# Patient Record
Sex: Female | Born: 1950 | Race: White | Hispanic: No | Marital: Married | Smoking: Never smoker
Health system: Southern US, Community
[De-identification: ages and names within clinical notes are randomized; demographics above are authoritative.]

## PROBLEM LIST (undated history)

## (undated) DIAGNOSIS — F419 Anxiety disorder, unspecified: Secondary | ICD-10-CM

## (undated) DIAGNOSIS — C801 Malignant (primary) neoplasm, unspecified: Secondary | ICD-10-CM

## (undated) DIAGNOSIS — M1990 Unspecified osteoarthritis, unspecified site: Secondary | ICD-10-CM

## (undated) DIAGNOSIS — F329 Major depressive disorder, single episode, unspecified: Secondary | ICD-10-CM

## (undated) DIAGNOSIS — F32A Depression, unspecified: Secondary | ICD-10-CM

## (undated) DIAGNOSIS — E785 Hyperlipidemia, unspecified: Secondary | ICD-10-CM

## (undated) DIAGNOSIS — Z85528 Personal history of other malignant neoplasm of kidney: Secondary | ICD-10-CM

## (undated) DIAGNOSIS — S838X2A Sprain of other specified parts of left knee, initial encounter: Secondary | ICD-10-CM

## (undated) DIAGNOSIS — E119 Type 2 diabetes mellitus without complications: Secondary | ICD-10-CM

## (undated) DIAGNOSIS — H53003 Unspecified amblyopia, bilateral: Secondary | ICD-10-CM

## (undated) DIAGNOSIS — I10 Essential (primary) hypertension: Secondary | ICD-10-CM

## (undated) HISTORY — PX: BREAST EXCISIONAL BIOPSY: SUR124

## (undated) HISTORY — PX: EYE SURGERY: SHX253

## (undated) HISTORY — PX: JOINT REPLACEMENT: SHX530

## (undated) HISTORY — PX: BREAST SURGERY: SHX581

## (undated) HISTORY — PX: APPENDECTOMY: SHX54

## (undated) HISTORY — PX: TONSILLECTOMY: SUR1361

## (undated) HISTORY — PX: TUBAL LIGATION: SHX77

## (undated) HISTORY — PX: ABDOMINAL HYSTERECTOMY: SHX81

## (undated) HISTORY — PX: CHOLECYSTECTOMY: SHX55

## (undated) HISTORY — PX: CATARACT EXTRACTION W/ INTRAOCULAR LENS  IMPLANT, BILATERAL: SHX1307

---

## 1997-11-20 ENCOUNTER — Other Ambulatory Visit: Admission: RE | Admit: 1997-11-20 | Discharge: 1997-11-20 | Payer: Self-pay | Admitting: Obstetrics and Gynecology

## 1998-02-12 ENCOUNTER — Encounter: Payer: Self-pay | Admitting: Obstetrics and Gynecology

## 1998-02-17 ENCOUNTER — Inpatient Hospital Stay (HOSPITAL_COMMUNITY): Admission: RE | Admit: 1998-02-17 | Discharge: 1998-02-19 | Payer: Self-pay | Admitting: Obstetrics and Gynecology

## 1999-06-09 ENCOUNTER — Encounter: Payer: Self-pay | Admitting: General Surgery

## 1999-06-09 ENCOUNTER — Encounter: Admission: RE | Admit: 1999-06-09 | Discharge: 1999-06-09 | Payer: Self-pay | Admitting: General Surgery

## 2000-06-12 ENCOUNTER — Encounter: Payer: Self-pay | Admitting: Obstetrics and Gynecology

## 2000-06-12 ENCOUNTER — Encounter: Admission: RE | Admit: 2000-06-12 | Discharge: 2000-06-12 | Payer: Self-pay | Admitting: Obstetrics and Gynecology

## 2000-07-07 ENCOUNTER — Encounter: Payer: Self-pay | Admitting: Internal Medicine

## 2000-07-07 ENCOUNTER — Encounter: Admission: RE | Admit: 2000-07-07 | Discharge: 2000-07-07 | Payer: Self-pay | Admitting: Internal Medicine

## 2000-07-12 ENCOUNTER — Ambulatory Visit (HOSPITAL_COMMUNITY): Admission: RE | Admit: 2000-07-12 | Discharge: 2000-07-12 | Payer: Self-pay | Admitting: Internal Medicine

## 2001-06-13 ENCOUNTER — Encounter: Admission: RE | Admit: 2001-06-13 | Discharge: 2001-06-13 | Payer: Self-pay | Admitting: Obstetrics and Gynecology

## 2001-06-13 ENCOUNTER — Encounter: Payer: Self-pay | Admitting: Obstetrics and Gynecology

## 2001-06-27 ENCOUNTER — Encounter: Admission: RE | Admit: 2001-06-27 | Discharge: 2001-06-27 | Payer: Self-pay | Admitting: Internal Medicine

## 2001-06-27 ENCOUNTER — Encounter: Payer: Self-pay | Admitting: Internal Medicine

## 2002-06-18 ENCOUNTER — Encounter: Payer: Self-pay | Admitting: Obstetrics and Gynecology

## 2002-06-18 ENCOUNTER — Encounter: Admission: RE | Admit: 2002-06-18 | Discharge: 2002-06-18 | Payer: Self-pay | Admitting: Obstetrics and Gynecology

## 2003-06-06 ENCOUNTER — Other Ambulatory Visit: Admission: RE | Admit: 2003-06-06 | Discharge: 2003-06-06 | Payer: Self-pay | Admitting: Internal Medicine

## 2003-06-19 ENCOUNTER — Encounter: Admission: RE | Admit: 2003-06-19 | Discharge: 2003-06-19 | Payer: Self-pay | Admitting: Internal Medicine

## 2003-07-01 ENCOUNTER — Encounter: Admission: RE | Admit: 2003-07-01 | Discharge: 2003-07-01 | Payer: Self-pay | Admitting: Internal Medicine

## 2004-07-02 ENCOUNTER — Encounter: Admission: RE | Admit: 2004-07-02 | Discharge: 2004-07-02 | Payer: Self-pay | Admitting: Internal Medicine

## 2005-07-04 ENCOUNTER — Encounter: Admission: RE | Admit: 2005-07-04 | Discharge: 2005-07-04 | Payer: Self-pay | Admitting: Internal Medicine

## 2005-07-22 ENCOUNTER — Encounter: Admission: RE | Admit: 2005-07-22 | Discharge: 2005-07-22 | Payer: Self-pay | Admitting: Internal Medicine

## 2005-07-25 ENCOUNTER — Ambulatory Visit (HOSPITAL_COMMUNITY): Admission: RE | Admit: 2005-07-25 | Discharge: 2005-07-25 | Payer: Self-pay | Admitting: Urology

## 2005-08-16 ENCOUNTER — Inpatient Hospital Stay (HOSPITAL_COMMUNITY): Admission: RE | Admit: 2005-08-16 | Discharge: 2005-08-18 | Payer: Self-pay | Admitting: Obstetrics and Gynecology

## 2005-08-16 ENCOUNTER — Encounter (INDEPENDENT_AMBULATORY_CARE_PROVIDER_SITE_OTHER): Payer: Self-pay | Admitting: Specialist

## 2005-08-16 HISTORY — PX: OTHER SURGICAL HISTORY: SHX169

## 2005-12-01 ENCOUNTER — Ambulatory Visit (HOSPITAL_COMMUNITY): Admission: RE | Admit: 2005-12-01 | Discharge: 2005-12-01 | Payer: Self-pay | Admitting: *Deleted

## 2005-12-30 ENCOUNTER — Ambulatory Visit: Payer: Self-pay | Admitting: Hematology and Oncology

## 2006-06-06 ENCOUNTER — Ambulatory Visit (HOSPITAL_COMMUNITY): Admission: RE | Admit: 2006-06-06 | Discharge: 2006-06-06 | Payer: Self-pay | Admitting: Urology

## 2006-07-06 ENCOUNTER — Encounter: Admission: RE | Admit: 2006-07-06 | Discharge: 2006-07-06 | Payer: Self-pay | Admitting: Internal Medicine

## 2006-07-13 ENCOUNTER — Encounter: Admission: RE | Admit: 2006-07-13 | Discharge: 2006-07-13 | Payer: Self-pay | Admitting: Orthopedic Surgery

## 2006-12-05 ENCOUNTER — Encounter: Admission: RE | Admit: 2006-12-05 | Discharge: 2006-12-05 | Payer: Self-pay | Admitting: Urology

## 2006-12-06 ENCOUNTER — Encounter: Admission: RE | Admit: 2006-12-06 | Discharge: 2006-12-06 | Payer: Self-pay | Admitting: Urology

## 2007-05-18 ENCOUNTER — Ambulatory Visit (HOSPITAL_COMMUNITY): Admission: RE | Admit: 2007-05-18 | Discharge: 2007-05-18 | Payer: Self-pay | Admitting: Urology

## 2007-06-05 ENCOUNTER — Ambulatory Visit (HOSPITAL_COMMUNITY): Admission: RE | Admit: 2007-06-05 | Discharge: 2007-06-06 | Payer: Self-pay | Admitting: Orthopedic Surgery

## 2007-06-05 HISTORY — PX: OTHER SURGICAL HISTORY: SHX169

## 2007-08-27 ENCOUNTER — Encounter: Admission: RE | Admit: 2007-08-27 | Discharge: 2007-08-27 | Payer: Self-pay | Admitting: Internal Medicine

## 2007-09-10 ENCOUNTER — Encounter: Admission: RE | Admit: 2007-09-10 | Discharge: 2007-09-10 | Payer: Self-pay | Admitting: Internal Medicine

## 2007-10-04 IMAGING — CT CT PELVIS W/ CM
1 of 6 series · 12 of 32 positions shown, 18 images · IV contrast (READICAT/WATER & [ID] OMNI 300)
Comparison: none

CLINICAL DATA: Left renal mass noted on virtual colonoscopy. 
 ABDOMEN CT WITH CONTRAST:
TECHNIQUE: Multidetector CT imaging of the abdomen was performed following the standard protocol during bolus administration of intravenous contrast.
 Contrast:  125 cc Omnipaque 300
TECHNIQUE: Multidetector CT imaging of the pelvis was performed following the standard protocol during bolus administration of intravenous contrast.

[Series 4: recon 3: routine abdomen · axial · 0.94mm/px · z∈[-413,-16]mm · 12 of 372 slices shown, 18 images]
[im 27/372  soft-tissue]
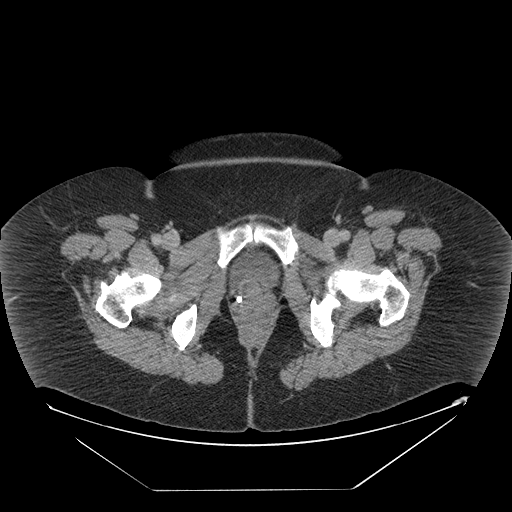
[im 27/372  bone]
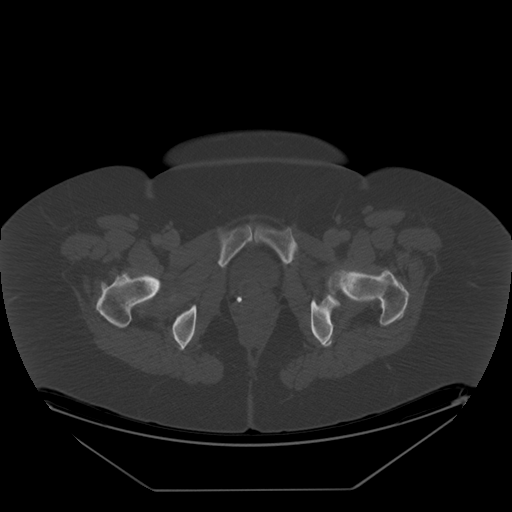
[im 54/372  soft-tissue]
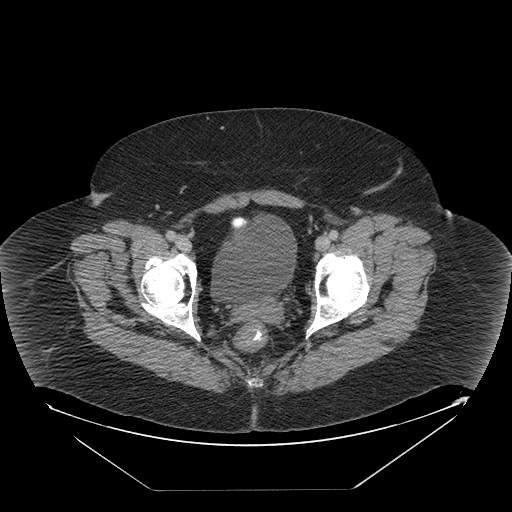
[im 80/372  soft-tissue]
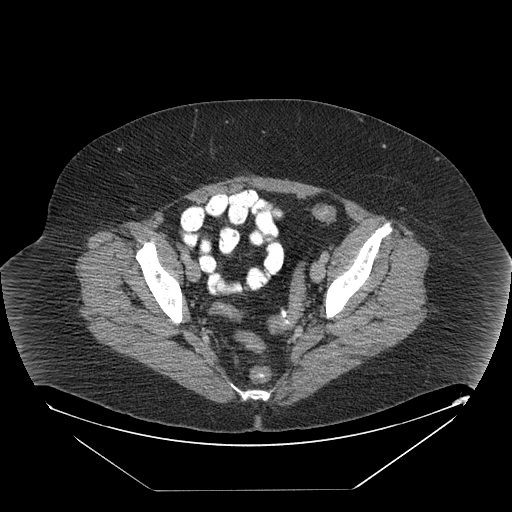
[im 107/372  soft-tissue]
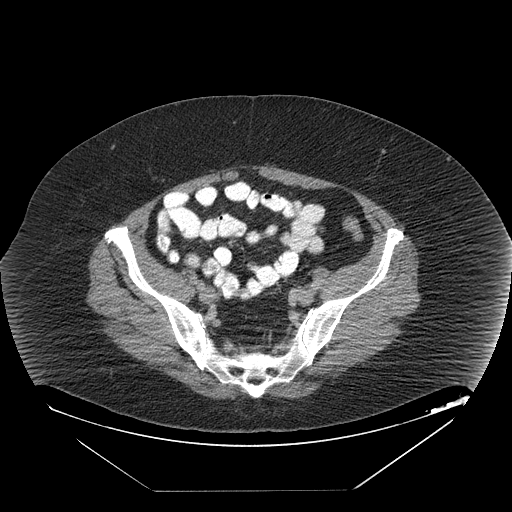
[im 133/372  soft-tissue]
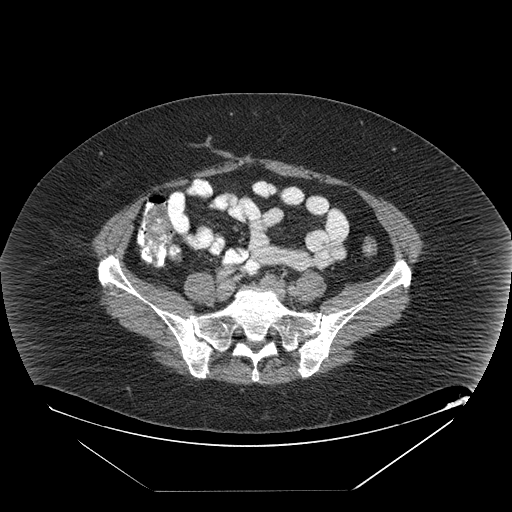
[im 160/372  soft-tissue]
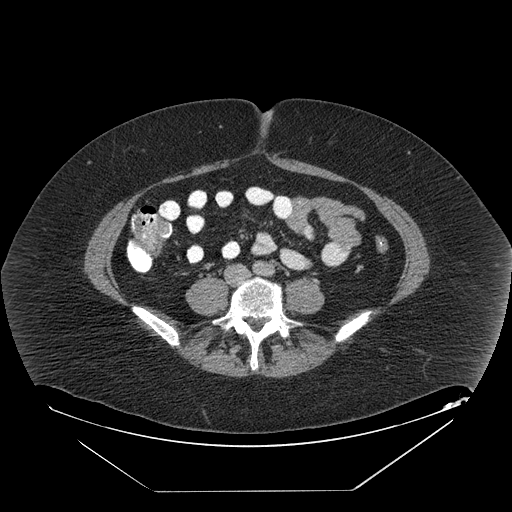
[im 213/372  soft-tissue]
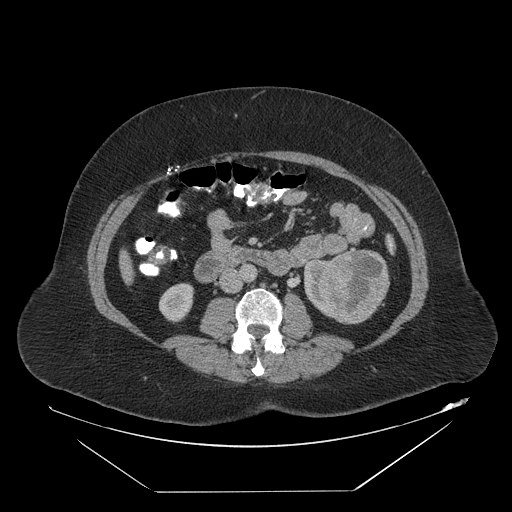
[im 239/372  soft-tissue]
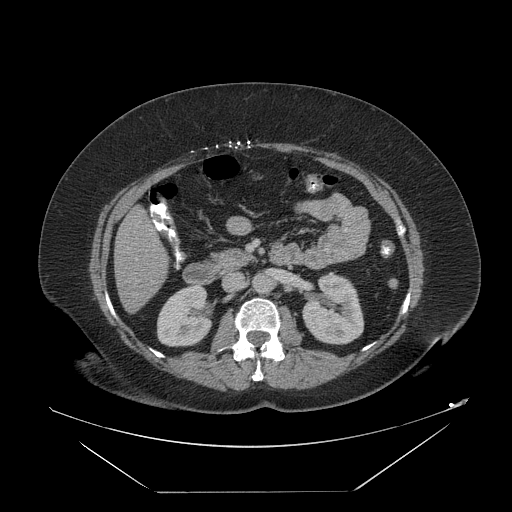
[im 266/372  soft-tissue]
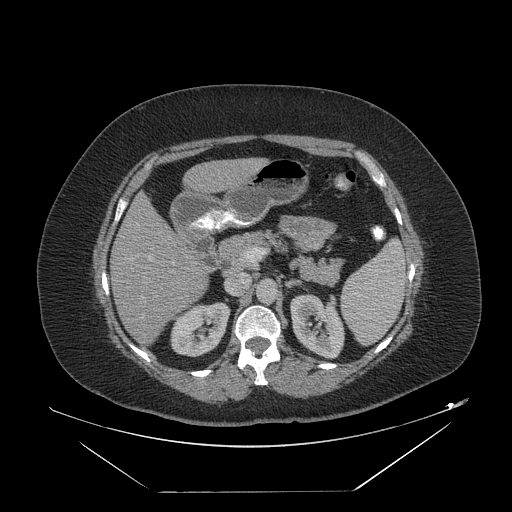
[im 266/372  lung]
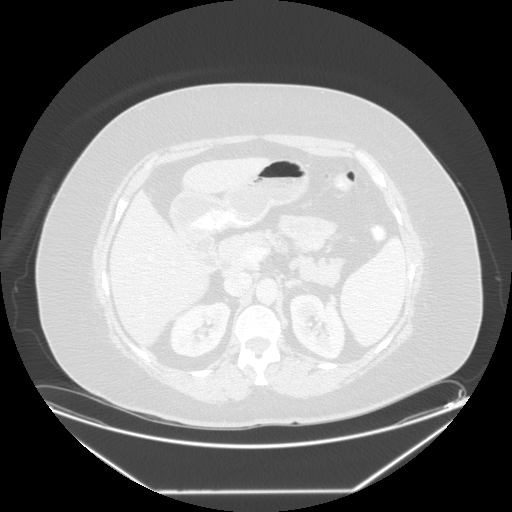
[im 266/372  bone]
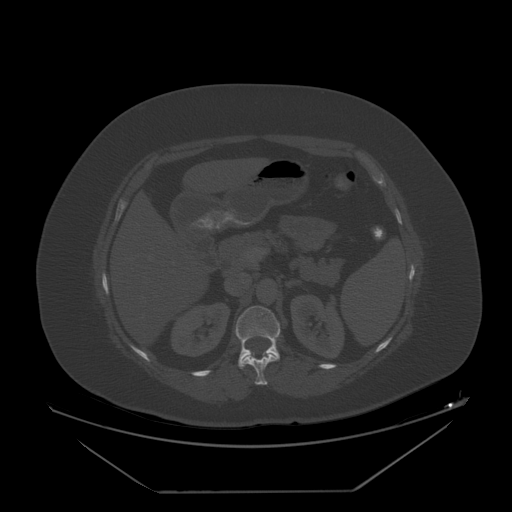
[im 292/372  soft-tissue]
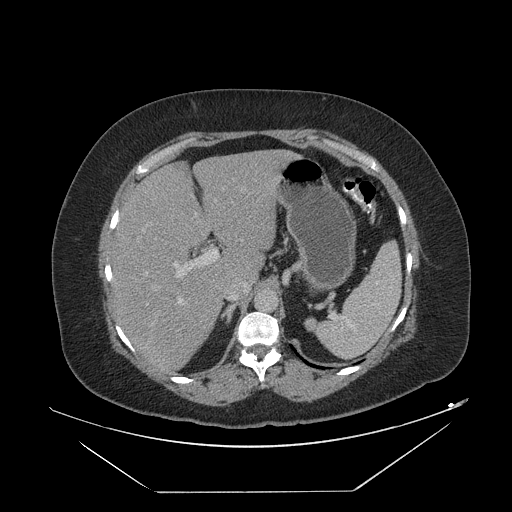
[im 292/372  lung]
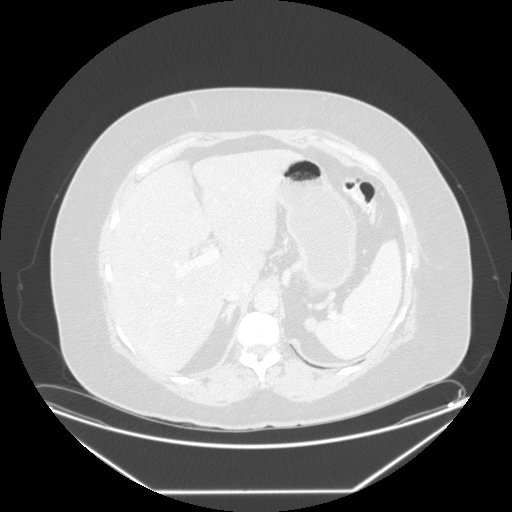
[im 319/372  soft-tissue]
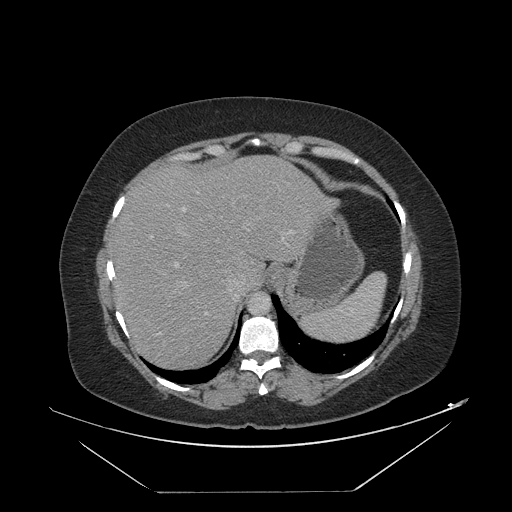
[im 319/372  lung]
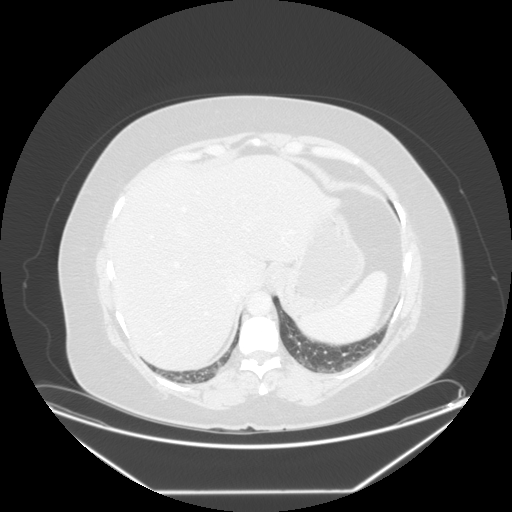
[im 345/372  soft-tissue]
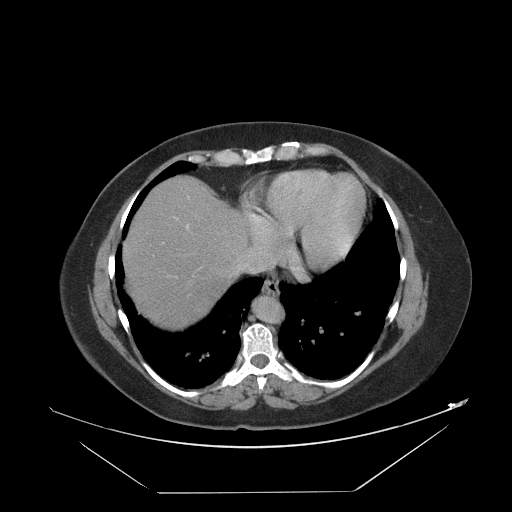
[im 345/372  lung]
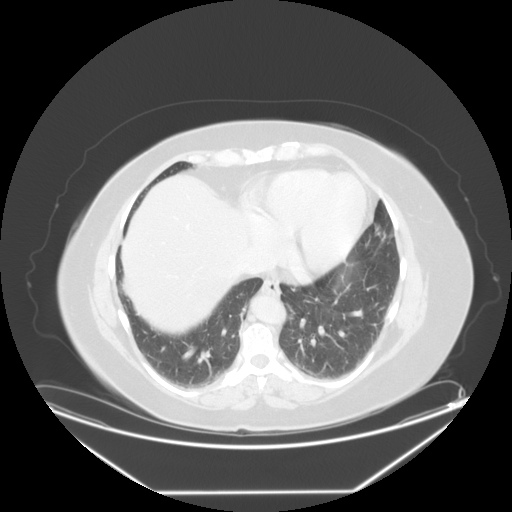

[12 of 32 positions shown; findings below may reference images not displayed]

FINDINGS: The lung bases are clear.  There is mild fatty infiltration throughout the liver without focal abnormality.   No ductal dilatation is seen.  The gallbladder appears to have been removed.  The pancreas is normal in size with normal peripancreatic fat planes.  The adrenal glands and spleen appear normal.  Probable small splenule is noted just caudal to the tip of the spleen.  The kidneys enhance well and there is an enhancing mass of mixed attenuation emanating from the lower pole of the left kidney laterally.  In its largest dimension this mass measures 72 x 62 mm and on coronally reconstructed images has a height of 72 mm.  This is most consistent with renal cell carcinoma.  The left renal vein opacifies normally with no evidence of tumor invasion.  No surrounding enlarged nodes are seen.  There are slightly prominent vessels however along the lateral and caudal extent of this left renal tumor.
IMPRESSION: 1.  Left lower pole renal mass measuring 72 x 62 x 72 mm consistent with left renal cell carcinoma.  The left renal vein appears patent and no adenopathy is seen.  There are somewhat prominent draining veins along the inferior and lateral aspect of this left renal mass. 
 2.  Fatty infiltration of the liver.  
 PELVIS CT WITH CONTRAST:
FINDINGS: The urinary bladder is unremarkable.  The patient has previously undergone hysterectomy.  Scattered colonic diverticula are present.  No pelvic mass or adenopathy is seen.
IMPRESSION: Negative CT of the pelvis.

## 2007-11-20 ENCOUNTER — Ambulatory Visit (HOSPITAL_COMMUNITY): Admission: RE | Admit: 2007-11-20 | Discharge: 2007-11-20 | Payer: Self-pay | Admitting: Urology

## 2008-05-15 ENCOUNTER — Ambulatory Visit (HOSPITAL_COMMUNITY): Admission: RE | Admit: 2008-05-15 | Discharge: 2008-05-15 | Payer: Self-pay | Admitting: Urology

## 2008-10-01 ENCOUNTER — Encounter: Admission: RE | Admit: 2008-10-01 | Discharge: 2008-10-01 | Payer: Self-pay | Admitting: Internal Medicine

## 2008-11-06 ENCOUNTER — Ambulatory Visit (HOSPITAL_COMMUNITY): Admission: RE | Admit: 2008-11-06 | Discharge: 2008-11-06 | Payer: Self-pay | Admitting: Urology

## 2009-02-03 ENCOUNTER — Encounter: Admission: RE | Admit: 2009-02-03 | Discharge: 2009-02-03 | Payer: Self-pay | Admitting: Orthopedic Surgery

## 2009-05-11 ENCOUNTER — Ambulatory Visit (HOSPITAL_COMMUNITY): Admission: RE | Admit: 2009-05-11 | Discharge: 2009-05-11 | Payer: Self-pay | Admitting: Urology

## 2009-10-02 ENCOUNTER — Encounter: Admission: RE | Admit: 2009-10-02 | Discharge: 2009-10-02 | Payer: Self-pay | Admitting: Internal Medicine

## 2009-11-16 ENCOUNTER — Ambulatory Visit (HOSPITAL_COMMUNITY): Admission: RE | Admit: 2009-11-16 | Discharge: 2009-11-16 | Payer: Self-pay | Admitting: Urology

## 2010-05-24 ENCOUNTER — Ambulatory Visit (HOSPITAL_COMMUNITY)
Admission: RE | Admit: 2010-05-24 | Discharge: 2010-05-24 | Payer: Self-pay | Source: Home / Self Care | Attending: Urology | Admitting: Urology

## 2010-08-19 ENCOUNTER — Ambulatory Visit (HOSPITAL_COMMUNITY): Admission: RE | Admit: 2010-08-19 | Payer: Self-pay | Source: Ambulatory Visit | Admitting: Orthopedic Surgery

## 2010-09-14 ENCOUNTER — Other Ambulatory Visit: Payer: Self-pay | Admitting: Family Medicine

## 2010-09-14 DIAGNOSIS — Z1231 Encounter for screening mammogram for malignant neoplasm of breast: Secondary | ICD-10-CM

## 2010-09-14 NOTE — Op Note (Signed)
Christina Cook, KEMP              ACCOUNT NO.:  [id number]   MEDICAL RECORD NO.:  [id number]          PATIENT TYPE:  OIB   LOCATION:  1617                         FACILITY:  [facility]   PHYSICIAN:  Georges Lynch. Gioffre, M.D.DATE OF BIRTH:  05/09/1950   DATE OF PROCEDURE:  06/05/2007  DATE OF DISCHARGE:                               OPERATIVE REPORT   PREOPERATIVE DIAGNOSIS:  1. Severe impingement syndrome, right shoulder.  2. Torn rotator cuff tendon, right shoulder.   POSTOPERATIVE DIAGNOSIS:  1. Severe impingement syndrome, right shoulder.  2. Torn rotator cuff tendon, right shoulder.   OPERATION:  1. Open acromionectomy and acromioplasty, right shoulder.  2. Repair of a torn rotator cuff tendon, right shoulder, utilizing a      small Restore tendon patch.   PROCEDURE:  Under general anesthesia, routine orthopedic prepping and  draping of the right shoulder was carried out.  She had 2 grams IV  Ancef.  At this time, a small incision was made over the anterior aspect  of the right shoulder.  Bleeders were identified and cauterized.  Self-  retaining retractors were inserted.  Following that, I identified the  acromion.  She had a severely thickened downsloping acromion that was  literally upbiting and eroding into the rotator cuff.  I protected the  underlying cuff.  I excised the subdeltoid bursa first, protected the  cuff with a Bennett retractor, and did a partial acromionectomy and  acromioplasty with the oscillating saw and the bur.  Following that, I  irrigated out the shoulder and I bone waxed the under surface of the  acromion.  Following that, I went down and identified a tear of the  distal insertion of the rotator cuff.  I then sutured a Restore graft  into this area.  It was markedly thinned out.  No Mitek sutures were  necessary.  I thoroughly irrigated out the area and then I inserted some  thrombin soaked Gelfoam into the subacromial space.  Following that, I  then  reapproximated the deltoid tendon and muscle in the usual fashion.  The subcu was closed with 0 Vicryl and the skin with metal staples.  A  sterile Neosporin dressing was applied.  We did inject 0.5% Marcaine  with epinephrine, about 10 mL, into the operative site.           ______________________________  Georges Lynch. Darrelyn Hillock, M.D.     RAG/MEDQ  D:  06/05/2007  T:  06/05/2007  Job:  [id number]

## 2010-09-17 NOTE — Discharge Summary (Signed)
Christina Cook, Christina Cook              ACCOUNT NO.:  [id number]   MEDICAL RECORD NO.:  [id number]          PATIENT TYPE:  INP   LOCATION:  1413                         [facility]ACILITY:  [facility]   PHYSICIAN:  Excell Seltzer. Annabell Howells, M.D.    DATE OF BIRTH:  18-Nov-1950   DATE OF ADMISSION:  08/16/2005  DATE OF DISCHARGE:  08/18/2005                                 DISCHARGE SUMMARY   ADMITTING DIAGNOSES:  Left 6 cm lower pole mass suspicious for renal cell  carcinoma.   DISCHARGE DIAGNOSES:  Left 6 cm lower pole mass suspicious for renal cell  carcinoma.   PROCEDURE DONE:  Left hand assisted laparoscopic radical nephrectomy.   HISTORY OF PRESENT ILLNESS AND HOSPITAL COURSE:  Christina Cook is a 60 year old  lady who underwent a CT scan for ____.  She was noted to have a 6 cm left  lower pole mass suspicious for renal cancer.  _____ work-up at the time was  negative.  After extensive counseling she elected to undergo a hand assisted  laparoscopic left radical nephrectomy.  The patient was admitted on August 16, 2005 which she successfully underwent procedure.  Postoperatively the  patient had a Foley catheter in place and was observed for two days on the  floor.  Postoperative day one her Foley catheter was removed.  Patient  voided with no problems.  Postoperative day two her pain was adequately  controlled and patient was tolerating p.o. diet.  At that point the patient  was stable to go home.  At discharge the patient was afebrile.  Vital signs  were stable.  Abdomen was soft and nontender.  Her wound was clean, dry, and  intact with no evidence of wound drainage or erythema.  The patient was  subsequently discharged home on p.o. pain medications.  The patient has a  follow-up with Dr. Annabell Howells in about one to two weeks in the office for a  postoperative visit.  Should the patient have any fever, chills, nausea,  vomiting, abdominal pain, or wound drainage ____ she is to contact us or  come to the ED.     ______________________________  Terie Purser, MD      Excell Seltzer. Annabell Howells, M.D.  Electronically Signed    JH/MEDQ  D:  08/23/2005  T:  08/24/2005  Job:  [id number]

## 2010-09-17 NOTE — Op Note (Signed)
NAMENANCY, ARVIN              ACCOUNT NO.:  [id number]   MEDICAL RECORD NO.:  [id number]          PATIENT TYPE:  INP   LOCATION:  1413                         FACILITY:  [facility]   PHYSICIAN:  Excell Seltzer. Annabell Howells, M.D.    DATE OF BIRTH:  May 06, 1950   DATE OF PROCEDURE:  08/16/2005  DATE OF DISCHARGE:                                 OPERATIVE REPORT   PROCEDURE:  Left hand assisted laparoscopic radical nephrectomy.   PREOPERATIVE DIAGNOSIS:  Left renal mass.   POSTOPERATIVE DIAGNOSIS:  Left renal mass, probable renal cell carcinoma.   SURGEON:  Dr. Bjorn Pippin.   ASSISTANT:  Dr. Cornelious Bryant.   ANESTHESIA:  General.   DRAIN:  Foley catheter.   SPECIMEN:  Left kidney with mass.   BLOOD LOSS:  Approximately 200 mL.   COMPLICATIONS:  None.   INDICATIONS:  Ms. Grays is a 60 year old white female who underwent CT scan  for abdominal pain.  She was found to have a 6 cm left lower pole mass  suspicious for renal cell carcinoma.  Metastatic workup was negative.  She  is to undergo hand assisted laparoscopic left radical nephrectomy.   FINDINGS AND PROCEDURE:  The patient is given a gram Ancef.  She was fitted  with PAS hose.  She was taken to the operating room where general anesthetic  was induced.  She was rolled into about 45 degrees flank position on the  beanbag.  After she underwent after induction of anesthesia an 18-French  Foley catheter was inserted using sterile technique.  Then she was  positioned in a 45 degrees flank position using the beanbag. An axillary  roll was placed.  The legs and heels were padded appropriately.  An arm  support was used to secure the left arm.  Once in position, the table was  flexed and the patient was secured using cloth tape.   At this point the patient was prepped with Betadine solution and draped in  usual sterile fashion.  A approximately 5-6 cm incision was made lateral to  the umbilicus with the lower margin of the incision at the  level of the  umbilicus.  The subcutaneous tissue was incised with the Bovie down to the  fascia.  The anterior rectus fascia was opened with the Bovie.  A small  arterial bleeder was encountered and controlled with a 3-0 chromic suture  ligature.  The rectus muscle was reflected laterally and posterior sheath  was incised allowing entry into the peritoneal cavity.  Palpation revealed  no adhesions in the area of the incision and at this point a gel port hand  port was inserted and secured.   The12 mm trocar was placed through the gel port opening and the abdomen was  insufflated.  A 0 degrees lens was then passed through the port and the  abdomen was inspected.  No significant adhesions were noted.  A 12 mm trocar  was placed approximately 5 mm lateral to the edge of the gel port, the skin  was incised with a knife.  The fat was spread with hemostat and the  trocar  was placed under visual guidance.  Once in position, the camera was moved to  this port and the CO2 insufflation was placed through this port as well.  We  then placed port.  A second 12 mm port was placed approximately midway  between the top of the gel port and the xiphoid. Similar incision was made.  The fat was spread and the trocar was placed under direct vision.  The  camera was then moved to this port.   At this point a lap port was placed through the gel port and my hand was  inserted.  Using the Harmonic scissors the dissection was initiated.  The  white line was incised from the level of spleen down off the lower pole the  kidney and the colon was reflected medially. I then took some additional  renal ligaments down and attachment of the spleen to the abdominal wall.  At  this point I moved to the lower pole where the lower pole attachments were  taken down.  The gonadal vein was identified and divided between Hem-o-lok  clips.  The ureter was then identified and divided between Hem-o-lok clips.  Dissection plane  was developed between the body wall and Gerota's fascia  posteriorly leaving lateral attachments initially.  The dissection was  carried up into the hilar region once again using finger dissection and the  Harmonic scissors.  The vein was identified and the artery could be palpable  posterior to the vein.  I then dissected superior to the to the pedicle and  created a window that allowed me to encircle the vascular pedicle with my  fingers.  The pedicle was thinned out until it consisted of the artery, vein  and a small amount of surrounding lymphatic and fatty tissue.  Endo-GIA was  then passed and articulated in the appropriate direction and manipulated  around the vascular pedicle.  Once appropriately seated, the device was  closed and fired dividing the pedicle.  Inspection of the pedicle revealed  no oozing or bleeding.  The residual attachments of the upper pole were then  taken down with finger dissection and harmonic scalpel.  The adrenal gland  was retained.  Once this portion of the dissection had been completed, the  remaining lateral attachments were taken down freeing the kidney.  The  kidney was moved out of the renal fossa and inspection revealed no active  bleeding.  The splenic capsule was intact.   At this point, the lap pad was removed from the abdominal cavity.  An  12 mm  lateral port was removed and a large entrapment sac was passed through this  port site.  The kidney was then placed in the entrapment sac and secured.  The delivery device was removed.  The string was then moved to the gel port.  Final inspection demonstrated no bleeding from the lateral port site.  The  12 mm trocar was reinserted through this port site and the superior port  site was evaluated as well and the trocar was removed under direct vision  once again without evidence of bleeding.  At this point the lateral trocar and camera removed once again.  The gel port cap was then removed and the   specimen was retrieved without need for extending the incision.  The inner  portion of the gel port was then removed and the incision was closed using a  running #1 PDS on the rectus fascia.  The subcutaneous tissue was irrigated  and the skin was closed with skin staples.  The two laparoscopy ports were  also closed with skin staples after irrigation.   At this point a dressing was applied.  The drapes removed.  The patient was  rolled back supine, her anesthetic was reversed and she was removed to the  recovery room in stable condition.  There were no complications.      Excell Seltzer. Annabell Howells, M.D.  Electronically Signed     JJW/MEDQ  D:  08/16/2005  T:  08/17/2005  Job:  [id number]   cc:   Soyla Murphy. Renne Crigler, M.D.  Fax: [phone]

## 2010-09-17 NOTE — Op Note (Signed)
Christina Cook, Christina Cook              ACCOUNT NO.:  [id number]   MEDICAL RECORD NO.:  [id number]          PATIENT TYPE:  AMB   LOCATION:  ENDO                         FACILITY:  [facility]   PHYSICIAN:  Georgiana Spinner, M.D.    DATE OF BIRTH:  30-Sep-1950   DATE OF PROCEDURE:  12/01/2005  DATE OF DISCHARGE:                                 OPERATIVE REPORT   PROCEDURE:  Colonoscopy.   INDICATIONS:  Questionable area of abnormality found on virtual CT scan.   ANESTHESIA:  1.  Demerol 100 mg.  2.  Versed 10 mg.   PROCEDURE:  With the patient mildly sedated in the left lateral decubitus  position, the Olympus videoscopic colonoscope was inserted into the rectum  and passed under direct vision to the cecum, identified by ileocecal valve  and appendiceal orifice, both of which were photographed. From this point,  the colonoscope was slowly withdrawn, taking circumferential views of the  colonic mucosa, and as we approached 50 cm from anal verge I made extra care  to look at the areas under every fold as best I could, one back to just  distal to the 30 cm mark.  I reinserted back to the 50 cm mark, then pulled  back once again, taking circumferential views once again of the colonic  mucosa until we reached the rectum, which appeared normal on direct and  showed hemorrhoidal tissue on retroflexed view. The endoscope was then  straightened and readvanced to 70 cm from anal verge, and once again was  withdrawn slowly, taking circumferential views of colonic mucosa, stopping  only to notice the diverticula seen in the sigmoid colon and the thickening  of the colon noted there until we reached the rectum once again, at which  point we withdrew the endoscope.  The patient's vital signs and pulse  oximeter remained stable.  The patient tolerated procedure well, with no  apparent complications.   FINDINGS:  Diverticulosis of sigmoid colon.  Internal hemorrhoids, but  otherwise an unremarkable  examination. Thickening of the sigmoid colon noted  in the area of the diverticulosis, but no other abnormalities noted on  careful inspection of the colon.   PLAN:  Consider repeat examination possibly in 3 years.           ______________________________  Georgiana Spinner, M.D.     GMO/MEDQ  D:  12/01/2005  T:  12/01/2005  Job:  [id number]

## 2010-10-04 ENCOUNTER — Ambulatory Visit
Admission: RE | Admit: 2010-10-04 | Discharge: 2010-10-04 | Disposition: A | Discharge status: Home / Self Care | Payer: 59 | Source: Ambulatory Visit | Attending: Family Medicine | Admitting: Family Medicine

## 2010-10-04 DIAGNOSIS — Z1231 Encounter for screening mammogram for malignant neoplasm of breast: Secondary | ICD-10-CM

## 2010-10-05 ENCOUNTER — Other Ambulatory Visit: Payer: Self-pay | Admitting: Family Medicine

## 2010-10-05 DIAGNOSIS — R928 Other abnormal and inconclusive findings on diagnostic imaging of breast: Secondary | ICD-10-CM

## 2010-10-13 ENCOUNTER — Ambulatory Visit
Admission: RE | Admit: 2010-10-13 | Discharge: 2010-10-13 | Disposition: A | Discharge status: Home / Self Care | Payer: 59 | Source: Ambulatory Visit | Attending: Family Medicine | Admitting: Family Medicine

## 2010-10-13 DIAGNOSIS — R928 Other abnormal and inconclusive findings on diagnostic imaging of breast: Secondary | ICD-10-CM

## 2010-10-20 ENCOUNTER — Other Ambulatory Visit: Payer: Self-pay | Admitting: Family Medicine

## 2010-10-20 DIAGNOSIS — R922 Inconclusive mammogram: Secondary | ICD-10-CM

## 2011-01-20 LAB — COMPREHENSIVE METABOLIC PANEL
Albumin: 3.9
Alkaline Phosphatase: 84
BUN: 14
Chloride: 104
Creatinine, Ser: 1.32 — ABNORMAL HIGH
Glucose, Bld: 91
Total Bilirubin: 0.7
Total Protein: 7

## 2011-01-20 LAB — CBC
HCT: 37.1
Hemoglobin: 12.8
MCV: 82.5
Platelets: 324
RDW: 13.9

## 2011-01-20 LAB — DIFFERENTIAL
Basophils Absolute: 0
Basophils Relative: 0
Lymphocytes Relative: 23
Monocytes Absolute: 0.7
Neutro Abs: 6.9
Neutrophils Relative %: 68

## 2011-01-20 LAB — URINALYSIS, ROUTINE W REFLEX MICROSCOPIC
Bilirubin Urine: NEGATIVE
Ketones, ur: NEGATIVE
Nitrite: NEGATIVE
Protein, ur: NEGATIVE
Urobilinogen, UA: 0.2
pH: 5.5

## 2011-01-20 LAB — PROTIME-INR
INR: 0.9
Prothrombin Time: 12.8

## 2011-01-20 LAB — APTT: aPTT: 32

## 2011-04-06 ENCOUNTER — Other Ambulatory Visit: Payer: Self-pay | Admitting: Family Medicine

## 2011-04-06 ENCOUNTER — Ambulatory Visit
Admission: RE | Admit: 2011-04-06 | Discharge: 2011-04-06 | Disposition: A | Discharge status: Home / Self Care | Payer: 59 | Source: Ambulatory Visit | Attending: Family Medicine | Admitting: Family Medicine

## 2011-04-06 DIAGNOSIS — R921 Mammographic calcification found on diagnostic imaging of breast: Secondary | ICD-10-CM

## 2011-04-06 DIAGNOSIS — R922 Inconclusive mammogram: Secondary | ICD-10-CM

## 2011-10-11 ENCOUNTER — Ambulatory Visit
Admission: RE | Admit: 2011-10-11 | Discharge: 2011-10-11 | Disposition: A | Discharge status: Home / Self Care | Payer: 59 | Source: Ambulatory Visit | Attending: Family Medicine | Admitting: Family Medicine

## 2011-10-11 ENCOUNTER — Other Ambulatory Visit: Payer: Self-pay | Admitting: Family Medicine

## 2011-10-11 DIAGNOSIS — R921 Mammographic calcification found on diagnostic imaging of breast: Secondary | ICD-10-CM

## 2012-09-05 ENCOUNTER — Other Ambulatory Visit: Payer: Self-pay

## 2012-09-05 ENCOUNTER — Other Ambulatory Visit: Payer: Self-pay | Admitting: Family Medicine

## 2012-09-05 DIAGNOSIS — Z1231 Encounter for screening mammogram for malignant neoplasm of breast: Secondary | ICD-10-CM

## 2012-10-11 ENCOUNTER — Ambulatory Visit
Admission: RE | Admit: 2012-10-11 | Discharge: 2012-10-11 | Disposition: A | Discharge status: Home / Self Care | Payer: 59 | Source: Ambulatory Visit

## 2012-10-11 DIAGNOSIS — Z1231 Encounter for screening mammogram for malignant neoplasm of breast: Secondary | ICD-10-CM

## 2012-11-04 ENCOUNTER — Other Ambulatory Visit: Payer: Self-pay | Admitting: Surgical

## 2012-11-27 ENCOUNTER — Encounter (HOSPITAL_BASED_OUTPATIENT_CLINIC_OR_DEPARTMENT_OTHER): Payer: Self-pay | Admitting: *Deleted

## 2012-11-27 NOTE — Progress Notes (Signed)
NPO AFTER MN. ARRIVES AT 0600. NEEDS ISTAT AND EKG. WILL FELODIPINE AM OF SURG W/ SIP OF WATER.

## 2012-12-02 NOTE — H&P (Signed)
Christina Cook is an 62 y.o. female.   Chief Complaint: right knee pain HPI: Christina Cook presented with the chief complaint of right knee pain. Christina Cook recalled twisting Christina Cook knee when walking in the snow in February. Christina Cook rested the knee with no improvement of Christina Cook swelling and pain. Christina Cook developed a Baker's cyst in the popliteal space. MRI showed a torn medial meniscus in the right knee.   Past Medical History  Diagnosis Date  . Acute medial meniscal injury of left knee   . Hypertension   . Type 2 diabetes mellitus   . Hyperlipidemia   . Anxiety   . History of renal carcinoma     2007--  S/P LEFT NEPHRECTOMY--  NO CHEMORADIATION--  NO RECURRENCE  . Arthritis     Past Surgical History  Procedure Laterality Date  . Laparoscopic left radical nephrectomy  08-16-2005  . Right shoulder open rotator cuff repair  06-05-2007  . Cataract extraction w/ intraocular lens  implant, bilateral      History reviewed. No pertinent family history. Social History:  reports that Christina Cook has never smoked. Christina Cook has never used smokeless tobacco. Christina Cook reports that Christina Cook does not drink alcohol or use illicit drugs.  Allergies:  Allergies  Allergen Reactions  . Dramamine (Dimenhydrinate) Nausea And Vomiting    Current outpatient prescriptions: aspirin 81 MG tablet, Take 81 mg by mouth daily., Disp: , Rfl: ;   Calcium Carbonate-Vitamin D (CALCIUM 600 + D PO), Take 2 tablets by mouth daily., Disp: , Rfl: ;   Cholecalciferol (VITAMIN D3) 1000 UNITS CAPS, Take 1 capsule by mouth daily., Disp: , Rfl: ;   escitalopram (LEXAPRO) 10 MG tablet, Take 10 mg by mouth daily., Disp: , Rfl:  felodipine (PLENDIL) 10 MG 24 hr tablet, Take 10 mg by mouth every morning., Disp: , Rfl: ;   losartan-hydrochlorothiazide (HYZAAR) 100-25 MG per tablet, Take 1 tablet by mouth every morning., Disp: , Rfl: ;   metFORMIN (GLUCOPHAGE) 1000 MG tablet, Take 1,000 mg by mouth 2 (two) times daily with a meal., Disp: , Rfl: ;   Multiple Vitamin  (MULTIVITAMIN) tablet, Take 1 tablet by mouth daily., Disp: , Rfl:  pyridoxine (B-6) 100 MG tablet, Take 100 mg by mouth daily., Disp: , Rfl: ;   simvastatin (ZOCOR) 20 MG tablet, Take 20 mg by mouth every evening., Disp: , Rfl:   Review of Systems  Constitutional: Negative.   HENT: Negative.  Negative for neck pain.   Eyes: Negative.   Respiratory: Negative.   Cardiovascular: Negative.   Gastrointestinal: Negative.   Genitourinary: Negative.   Musculoskeletal: Positive for joint pain. Negative for myalgias, back pain and falls.       Right knee pain  Skin: Negative.   Neurological: Negative.   Endo/Heme/Allergies: Negative.   Psychiatric/Behavioral: Negative.     Vitals BP: 130/82 (Sitting, Left Arm, Standard)  HR: 76 bpm Height 5\' 6"  (1.676 m), weight 123.832 kg (273 lb). Physical Exam  Constitutional: Christina Cook is oriented to person, place, and time. Christina Cook appears well-developed and well-nourished. No distress.  HENT:  Head: Normocephalic and atraumatic.  Right Ear: External ear normal.  Left Ear: External ear normal.  Nose: Nose normal.  Mouth/Throat: Oropharynx is clear and moist.  Eyes: EOM are normal.  Neck: Normal range of motion.  Cardiovascular: Normal rate, regular rhythm, normal heart sounds and intact distal pulses.   No murmur heard. Respiratory: Effort normal and breath sounds normal. No respiratory distress. Christina Cook has no wheezes.  GI: Soft.  Bowel sounds are normal. Christina Cook exhibits no distension. There is no tenderness.  Musculoskeletal:       Right hip: Normal.       Left hip: Normal.       Right knee: Christina Cook exhibits decreased range of motion, swelling, effusion and abnormal meniscus. Christina Cook exhibits no erythema. Tenderness found. Medial joint line tenderness noted. No lateral joint line tenderness noted.       Left knee: Normal.       Right lower leg: Christina Cook exhibits no tenderness and no swelling.       Left lower leg: Christina Cook exhibits no tenderness and no swelling.   Neurological: Christina Cook is alert and oriented to person, place, and time. Christina Cook has normal strength and normal reflexes. No sensory deficit.  Skin: No rash noted. Christina Cook is not diaphoretic. No erythema.     Assessment/Plan Right knee, medial meniscus tear Christina Cook needs a right knee arthroscopy with medial menisectomy. Risks and benefits of the procedure were discussed with the patient by Dr. Darrelyn Hillock. This will be an outpatient procedure.   Laurence Crofford LAUREN 12/02/2012, 2:22 PM

## 2012-12-04 ENCOUNTER — Encounter (HOSPITAL_BASED_OUTPATIENT_CLINIC_OR_DEPARTMENT_OTHER)
Admission: RE | Disposition: A | Discharge status: Home / Self Care | Payer: Self-pay | Source: Ambulatory Visit | Attending: Orthopedic Surgery

## 2012-12-04 ENCOUNTER — Encounter (HOSPITAL_BASED_OUTPATIENT_CLINIC_OR_DEPARTMENT_OTHER): Payer: Self-pay | Admitting: *Deleted

## 2012-12-04 ENCOUNTER — Encounter (HOSPITAL_BASED_OUTPATIENT_CLINIC_OR_DEPARTMENT_OTHER): Payer: Self-pay | Admitting: Anesthesiology

## 2012-12-04 ENCOUNTER — Ambulatory Visit (HOSPITAL_BASED_OUTPATIENT_CLINIC_OR_DEPARTMENT_OTHER): Payer: 59 | Admitting: Anesthesiology

## 2012-12-04 ENCOUNTER — Ambulatory Visit (HOSPITAL_BASED_OUTPATIENT_CLINIC_OR_DEPARTMENT_OTHER)
Admission: RE | Admit: 2012-12-04 | Discharge: 2012-12-04 | Disposition: A | Discharge status: Home / Self Care | Payer: 59 | Source: Ambulatory Visit | Attending: Orthopedic Surgery | Admitting: Orthopedic Surgery

## 2012-12-04 DIAGNOSIS — M23329 Other meniscus derangements, posterior horn of medial meniscus, unspecified knee: Secondary | ICD-10-CM | POA: Diagnosis present

## 2012-12-04 DIAGNOSIS — E785 Hyperlipidemia, unspecified: Secondary | ICD-10-CM | POA: Insufficient documentation

## 2012-12-04 DIAGNOSIS — M23359 Other meniscus derangements, posterior horn of lateral meniscus, unspecified knee: Secondary | ICD-10-CM | POA: Diagnosis present

## 2012-12-04 DIAGNOSIS — Z85528 Personal history of other malignant neoplasm of kidney: Secondary | ICD-10-CM | POA: Insufficient documentation

## 2012-12-04 DIAGNOSIS — E119 Type 2 diabetes mellitus without complications: Secondary | ICD-10-CM | POA: Insufficient documentation

## 2012-12-04 DIAGNOSIS — Z7982 Long term (current) use of aspirin: Secondary | ICD-10-CM | POA: Insufficient documentation

## 2012-12-04 DIAGNOSIS — I10 Essential (primary) hypertension: Secondary | ICD-10-CM | POA: Insufficient documentation

## 2012-12-04 DIAGNOSIS — M1712 Unilateral primary osteoarthritis, left knee: Secondary | ICD-10-CM

## 2012-12-04 DIAGNOSIS — M23302 Other meniscus derangements, unspecified lateral meniscus, unspecified knee: Secondary | ICD-10-CM | POA: Insufficient documentation

## 2012-12-04 DIAGNOSIS — Z79899 Other long term (current) drug therapy: Secondary | ICD-10-CM | POA: Insufficient documentation

## 2012-12-04 DIAGNOSIS — M1710 Unilateral primary osteoarthritis, unspecified knee: Secondary | ICD-10-CM | POA: Insufficient documentation

## 2012-12-04 HISTORY — DX: Personal history of other malignant neoplasm of kidney: Z85.528

## 2012-12-04 HISTORY — PX: KNEE ARTHROSCOPY WITH MEDIAL MENISECTOMY: SHX5651

## 2012-12-04 HISTORY — DX: Anxiety disorder, unspecified: F41.9

## 2012-12-04 HISTORY — DX: Unspecified osteoarthritis, unspecified site: M19.90

## 2012-12-04 HISTORY — DX: Type 2 diabetes mellitus without complications: E11.9

## 2012-12-04 HISTORY — DX: Sprain of other specified parts of left knee, initial encounter: S83.8X2A

## 2012-12-04 HISTORY — DX: Hyperlipidemia, unspecified: E78.5

## 2012-12-04 HISTORY — DX: Essential (primary) hypertension: I10

## 2012-12-04 LAB — POCT I-STAT 4, (NA,K, GLUC, HGB,HCT)
Potassium: 3.8 mEq/L (ref 3.5–5.1)
Sodium: 140 mEq/L (ref 135–145)

## 2012-12-04 SURGERY — ARTHROSCOPY, KNEE, WITH MEDIAL MENISCECTOMY
Anesthesia: General | Site: Knee | Laterality: Left | Wound class: Clean

## 2012-12-04 MED ORDER — OXYCODONE-ACETAMINOPHEN 10-325 MG PO TABS: 1.0000 | ORAL_TABLET | ORAL | Status: DC | PRN

## 2012-12-04 MED ORDER — LIDOCAINE HCL (CARDIAC) 20 MG/ML IV SOLN
INTRAVENOUS | Status: DC | PRN
  Administered 2012-12-04: 80 mg via INTRAVENOUS

## 2012-12-04 MED ORDER — PROMETHAZINE HCL 25 MG/ML IJ SOLN
6.2500 mg | INTRAMUSCULAR | Status: DC | PRN
  Filled 2012-12-04: qty 1

## 2012-12-04 MED ORDER — LACTATED RINGERS IV SOLN
INTRAVENOUS | Status: DC
  Administered 2012-12-04 (×2): via INTRAVENOUS
  Filled 2012-12-04: qty 1000

## 2012-12-04 MED ORDER — OXYCODONE-ACETAMINOPHEN 5-325 MG PO TABS
1.0000 | ORAL_TABLET | ORAL | Status: AC | PRN
  Administered 2012-12-04: 1 via ORAL
  Filled 2012-12-04: qty 1

## 2012-12-04 MED ORDER — LACTATED RINGERS IV SOLN
INTRAVENOUS | Status: DC
  Filled 2012-12-04: qty 1000

## 2012-12-04 MED ORDER — FENTANYL CITRATE 0.05 MG/ML IJ SOLN
INTRAMUSCULAR | Status: DC | PRN
  Administered 2012-12-04 (×2): 25 ug via INTRAVENOUS
  Administered 2012-12-04 (×3): 50 ug via INTRAVENOUS

## 2012-12-04 MED ORDER — BUPIVACAINE-EPINEPHRINE 0.25% -1:200000 IJ SOLN
INTRAMUSCULAR | Status: DC | PRN
  Administered 2012-12-04: 20 mL

## 2012-12-04 MED ORDER — HYDROMORPHONE HCL PF 1 MG/ML IJ SOLN
0.2500 mg | INTRAMUSCULAR | Status: DC | PRN
  Administered 2012-12-04 (×2): 0.25 mg via INTRAVENOUS
  Administered 2012-12-04: 0.5 mg via INTRAVENOUS
  Filled 2012-12-04: qty 1

## 2012-12-04 MED ORDER — PROPOFOL 10 MG/ML IV BOLUS
INTRAVENOUS | Status: DC | PRN
  Administered 2012-12-04: 200 mg via INTRAVENOUS

## 2012-12-04 MED ORDER — BACITRACIN-NEOMYCIN-POLYMYXIN 400-5-5000 EX OINT
TOPICAL_OINTMENT | CUTANEOUS | Status: DC | PRN
  Administered 2012-12-04: 1 via TOPICAL

## 2012-12-04 MED ORDER — ONDANSETRON HCL 4 MG/2ML IJ SOLN
INTRAMUSCULAR | Status: DC | PRN
  Administered 2012-12-04: 4 mg via INTRAVENOUS

## 2012-12-04 MED ORDER — DEXAMETHASONE SODIUM PHOSPHATE 4 MG/ML IJ SOLN
INTRAMUSCULAR | Status: DC | PRN
  Administered 2012-12-04: 8 mg via INTRAVENOUS

## 2012-12-04 MED ORDER — SODIUM CHLORIDE 0.9 % IR SOLN
Status: DC | PRN
  Administered 2012-12-04: 12000 mL

## 2012-12-04 MED ORDER — OXYCODONE HCL 5 MG PO TABS
5.0000 mg | ORAL_TABLET | ORAL | Status: AC | PRN
  Administered 2012-12-04: 5 mg via ORAL
  Filled 2012-12-04: qty 1

## 2012-12-04 MED ORDER — MIDAZOLAM HCL 5 MG/5ML IJ SOLN
INTRAMUSCULAR | Status: DC | PRN
  Administered 2012-12-04: 2 mg via INTRAVENOUS

## 2012-12-04 MED ORDER — CEFAZOLIN SODIUM-DEXTROSE 2-3 GM-% IV SOLR
2.0000 g | INTRAVENOUS | Status: AC
  Administered 2012-12-04: 2 g via INTRAVENOUS
  Filled 2012-12-04: qty 50

## 2012-12-04 MED ORDER — CHLORHEXIDINE GLUCONATE 4 % EX LIQD
60.0000 mL | Freq: Once | CUTANEOUS | Status: DC
  Filled 2012-12-04: qty 60

## 2012-12-04 SURGICAL SUPPLY — 40 items
BANDAGE ELASTIC 4 VELCRO ST LF (GAUZE/BANDAGES/DRESSINGS) ×2 IMPLANT
BANDAGE ELASTIC 6 VELCRO ST LF (GAUZE/BANDAGES/DRESSINGS) ×2 IMPLANT
BLADE CUDA 5.5 (BLADE) IMPLANT
BLADE CUTTER GATOR 3.5 (BLADE) IMPLANT
BLADE CUTTER MENIS 5.5 (BLADE) IMPLANT
BLADE GREAT WHITE 4.2 (BLADE) ×2 IMPLANT
BLADE SURG 15 STRL LF DISP TIS (BLADE) IMPLANT
BLADE SURG 15 STRL SS (BLADE)
BNDG COHESIVE 6X5 TAN NS LF (GAUZE/BANDAGES/DRESSINGS) ×3 IMPLANT
CANISTER SUCT LVC 12 LTR MEDI- (MISCELLANEOUS) ×2 IMPLANT
CANISTER SUCTION 2500CC (MISCELLANEOUS) ×3 IMPLANT
CLOTH BEACON ORANGE TIMEOUT ST (SAFETY) ×2 IMPLANT
DRAPE ARTHROSCOPY W/POUCH 114 (DRAPES) ×2 IMPLANT
DRAPE LG THREE QUARTER DISP (DRAPES) ×2 IMPLANT
DRSG EMULSION OIL 3X3 NADH (GAUZE/BANDAGES/DRESSINGS) ×2 IMPLANT
DRSG PAD ABDOMINAL 8X10 ST (GAUZE/BANDAGES/DRESSINGS) ×4 IMPLANT
DURAPREP 26ML APPLICATOR (WOUND CARE) ×2 IMPLANT
ELECT MENISCUS 165MM 90D (ELECTRODE) IMPLANT
ELECT REM PT RETURN 9FT ADLT (ELECTROSURGICAL)
ELECTRODE REM PT RTRN 9FT ADLT (ELECTROSURGICAL) IMPLANT
GAUZE SPONGE 4X4 12PLY STRL LF (GAUZE/BANDAGES/DRESSINGS) ×1 IMPLANT
GLOVE ECLIPSE 8.0 STRL XLNG CF (GLOVE) ×2 IMPLANT
GLOVE INDICATOR 8.5 STRL (GLOVE) ×4 IMPLANT
GLOVE SURG ORTHO 6.0 STRL STRW (GLOVE) ×1 IMPLANT
GOWN PREVENTION PLUS LG XLONG (DISPOSABLE) ×1 IMPLANT
GOWN STRL REIN XL XLG (GOWN DISPOSABLE) ×4 IMPLANT
KNEE WRAP E Z 3 GEL PACK (MISCELLANEOUS) ×2 IMPLANT
PACK ARTHROSCOPY DSU (CUSTOM PROCEDURE TRAY) ×2 IMPLANT
PACK BASIN DAY SURGERY FS (CUSTOM PROCEDURE TRAY) ×2 IMPLANT
PADDING CAST COTTON 6X4 STRL (CAST SUPPLIES) ×2 IMPLANT
PENCIL BUTTON HOLSTER BLD 10FT (ELECTRODE) IMPLANT
SET ARTHROSCOPY TUBING (MISCELLANEOUS) ×2
SET ARTHROSCOPY TUBING LN (MISCELLANEOUS) ×1 IMPLANT
SPONGE GAUZE 4X4 12PLY (GAUZE/BANDAGES/DRESSINGS) ×2 IMPLANT
SPONGE SURGIFOAM ABS GEL 12-7 (HEMOSTASIS) IMPLANT
SUT ETHILON 3 0 PS 1 (SUTURE) ×2 IMPLANT
SYR CONTROL 10ML LL (SYRINGE) IMPLANT
TOWEL OR 17X24 6PK STRL BLUE (TOWEL DISPOSABLE) ×2 IMPLANT
WAND 90 DEG TURBOVAC W/CORD (SURGICAL WAND) ×3 IMPLANT
WATER STERILE IRR 500ML POUR (IV SOLUTION) ×2 IMPLANT

## 2012-12-04 NOTE — Brief Op Note (Signed)
12/04/2012  8:30 AM  PATIENT:  Christina Cook  62 y.o. female  PRE-OPERATIVE DIAGNOSIS:  Left Knee Medial Mensicus Tear  POST-OPERATIVE DIAGNOSIS:  Left Knee Medial Mensicus Tear;Lateral meniscus Tear:Osteoarthritis.  PROCEDURE:  Procedure(s): LEFT KNEE ARTHROSCOPY WITH Partial MEDIAL MENISECTOMY, and Partial Lateral menisectomy, Abrasion chrondroplasty of left medial femoral condyle, Abrasion chrondroplasty of left lateral femoral condyle (Left);Microfracture technique of BOTH Medial and Lateral Femoral Condyles and Synovectomy of Suprapatellar Pouch  SURGEON:  Surgeon(s) and Role:    * Jacki Cones, MD - Primary    ASSISTANTS:OR Tech  ANESTHESIA:   general  EBL:  Total I/O In: 200 [I.V.:200] Out: -   BLOOD ADMINISTERED:none  DRAINS: none   LOCAL MEDICATIONS USED:  MARCAINE 30cc of 0.25%with Epinephrine.     SPECIMEN:  No Specimen  DISPOSITION OF SPECIMEN:  N/A  COUNTS:  YES  TOURNIQUET:  * No tourniquets in log *  DICTATION: .Other Dictation: Dictation Number [id number]  PLAN OF CARE: Discharge to home after PACU  PATIENT DISPOSITION:  PACU - hemodynamically stable.   Delay start of Pharmacological VTE agent (>24hrs) due to surgical blood loss or risk of bleeding: yes

## 2012-12-04 NOTE — Anesthesia Preprocedure Evaluation (Signed)
Anesthesia Evaluation  Patient identified by MRN, date of birth, ID band Patient awake    Reviewed: Allergy & Precautions, H&P , NPO status , Patient's Chart, lab work & pertinent test results  Airway Mallampati: II TM Distance: >3 FB Neck ROM: Full    Dental  (+) Teeth Intact and Dental Advisory Given   Pulmonary neg pulmonary ROS,  breath sounds clear to auscultation  Pulmonary exam normal       Cardiovascular hypertension, negative cardio ROS  Rhythm:Regular Rate:Normal     Neuro/Psych Anxiety negative neurological ROS     GI/Hepatic negative GI ROS, Neg liver ROS,   Endo/Other  diabetes, Type 2, Oral Hypoglycemic AgentsMorbid obesity  Renal/GU Renal diseaseHx renal CA; S/P Nephrectomy  negative genitourinary   Musculoskeletal negative musculoskeletal ROS (+)   Abdominal   Peds negative pediatric ROS (+)  Hematology negative hematology ROS (+)   Anesthesia Other Findings   Reproductive/Obstetrics negative OB ROS                           Anesthesia Physical Anesthesia Plan  ASA: III  Anesthesia Plan: General   Post-op Pain Management:    Induction: Intravenous  Airway Management Planned: LMA  Additional Equipment:   Intra-op Plan:   Post-operative Plan: Extubation in OR  Informed Consent: I have reviewed the patients History and Physical, chart, labs and discussed the procedure including the risks, benefits and alternatives for the proposed anesthesia with the patient or authorized representative who has indicated his/her understanding and acceptance.   Dental advisory given  Plan Discussed with: CRNA  Anesthesia Plan Comments:         Anesthesia Quick Evaluation

## 2012-12-04 NOTE — Transfer of Care (Signed)
Immediate Anesthesia Transfer of Care Note  Patient: Christina Cook  Procedure(s) Performed: Procedure(s) (LRB): LEFT KNEE ARTHROSCOPY WITH Partial MEDIAL MENISECTOMY, and Partial Lateral menisectomy, Abrasion chrondroplasty of left medial femoral condyle, Abrasion chrondroplasty of left lateral femoral condyle (Left)  Patient Location: PACU  Anesthesia Type: General  Level of Consciousness: awake, oriented, sedated and patient cooperative  Airway & Oxygen Therapy: Patient Spontanous Breathing and Patient connected to face mask oxygen  Post-op Assessment: Report given to PACU RN and Post -op Vital signs reviewed and stable  Post vital signs: Reviewed and stable  Complications: No apparent anesthesia complications

## 2012-12-04 NOTE — Anesthesia Postprocedure Evaluation (Signed)
Anesthesia Post Note  Patient: Christina Cook  Procedure(s) Performed: Procedure(s) (LRB): LEFT KNEE ARTHROSCOPY WITH Partial MEDIAL MENISECTOMY, and Partial Lateral menisectomy, Abrasion chrondroplasty of left medial femoral condyle, Abrasion chrondroplasty of left lateral femoral condyle (Left)  Anesthesia type: General  Patient location: PACU  Post pain: Pain level controlled  Post assessment: Post-op Vital signs reviewed  Last Vitals:  Filed Vitals:   12/04/12 1046  BP: 134/76  Pulse: 88  Temp: 36.3 C  Resp: 12    Post vital signs: Reviewed  Level of consciousness: sedated  Complications: No apparent anesthesia complications

## 2012-12-04 NOTE — Interval H&P Note (Signed)
History and Physical Interval Note:  12/04/2012 7:20 AM  Christina Cook  has presented today for surgery, with the diagnosis of Left Knee Medial Mensicus Tear  The various methods of treatment have been discussed with the patient and family. After consideration of risks, benefits and other options for treatment, the patient has consented to  Procedure(s): LEFT KNEE ARTHROSCOPY WITH MEDIAL MENISECTOMY (Left) as a surgical intervention .  The patient's history has been reviewed, patient examined, no change in status, stable for surgery.  I have reviewed the patient's chart and labs.  Questions were answered to the patient's satisfaction.     Kahlia Lagunes A

## 2012-12-04 NOTE — Op Note (Signed)
NAMEEVALEEN, SANT              ACCOUNT NO.:  [id number]  MEDICAL RECORD NO.:  [id number]  LOCATION:                                 FACILITY:  PHYSICIAN:  Georges Lynch. Darrelyn Hillock, M.D.     DATE OF BIRTH:  DATE OF PROCEDURE:  12/04/2012 DATE OF DISCHARGE:                              OPERATIVE REPORT   SURGEON:  Georges Lynch. Tremont Gavitt, M.D.  ASSISTANT:  The OR tech.  PREOPERATIVE DIAGNOSIS: 1. Osteoarthritis left knee. 2. Torn medial meniscus, left knee. 3. Torn lateral meniscus, left knee. 4. BMI was 43.  POSTOPERATIVE DIAGNOSIS: 1. Osteoarthritis left knee. 2. Torn medial meniscus, left knee. 3. Torn lateral meniscus, left knee. 4. BMI was 43.  OPERATION: 1. Diagnostic arthroscopy, left knee. 2. Medial meniscectomy, left knee. 3. Lateral meniscectomy, left knee. 4. Synovectomy and suprapatellar pouch, left knee. 5. Abrasion chondroplasty of the medial femoral condyle, left knee. 6. Abrasion chondroplasty of the lateral femoral condyle, left knee. 7. Microfracture technique medial femoral condyle, left knee. 8. Microfracture technique, lateral femoral condyle, left knee.  PROCEDURE IN DETAIL:  Under general anesthesia, routine orthopedic prep and draping of the left lower extremity was carried out.  The left leg was in the knee holder.  The appropriate time-out was first carried out. I also marked the appropriate left leg in the holding area.  At this time, a small punctate incision was made in suprapatellar pouch.  After she had 2 g of IV Ancef.  The inflow cannula was inserted.  Knee was distended with saline.  Another small punctate incision was made in the anterolateral joint.  The arthroscope was entered from lateral approach. I went up in the suprapatellar pouch.  She had severe chondromalacia of the patellofemoral joint.  She had severe synovitis in the suprapatellar pouch.  I introduced the ArthroCare and did a synovectomy.  I went down into the medial joint, she had  a tear of the posterior horn of the medial meniscus and a partial medial meniscectomy.  She had rather significant chondromalacia of the medial femoral condyle.  I did an abrasion chondroplasty of the medial femoral condyle followed by a microfracture technique.  Cruciates were intact, I went over the lateral joint, she had significant arthritic changes in the lateral joint as well.  She had significant chondromalacia of the lateral femoral condyle.  I performed a abrasion chondroplasty of the lateral femoral condyle followed by microfracture technique on the lateral femoral condyle.  She also had a tear of the lateral meniscus.  I did a partial lateral meniscectomy as well.  I thoroughly irrigated out the knee and removed all the fluid, closed all 3 punctate incisions with 3-0 nylon suture.  I injected 30 mL of 0.25% Marcaine with epinephrine in the knee joint and a sterile Neosporin dressing was applied.  Postop, she will be on aspirin 325 mg b.i.d. starting today and b.i.d. for 2 weeks and try to prevent any blood clots.  He also had 2 g of IV Ancef.  She will be on a walker partial to full weightbearing as tolerated.  She will see me in 10-12 days in the office or prior to if there is  any particular problem.          ______________________________ Georges Lynch Darrelyn Hillock, M.D.     RAG/MEDQ  D:  12/04/2012  T:  12/04/2012  Job:  [id number]

## 2012-12-04 NOTE — Anesthesia Procedure Notes (Signed)
Procedure Name: LMA Insertion Date/Time: 12/04/2012 7:33 AM Performed by: Renella Cunas D Pre-anesthesia Checklist: Patient identified, Emergency Drugs available, Suction available and Patient being monitored Patient Re-evaluated:Patient Re-evaluated prior to inductionOxygen Delivery Method: Circle System Utilized Preoxygenation: Pre-oxygenation with 100% oxygen Intubation Type: IV induction Ventilation: Mask ventilation without difficulty LMA: LMA inserted LMA Size: 4.0 Number of attempts: 1 Airway Equipment and Method: bite block Placement Confirmation: positive ETCO2 Tube secured with: Tape Dental Injury: Teeth and Oropharynx as per pre-operative assessment

## 2012-12-04 NOTE — H&P (View-Only) (Signed)
NPO AFTER MN. ARRIVES AT 0600. NEEDS ISTAT AND EKG. WILL FELODIPINE AM OF SURG W/ SIP OF WATER. 

## 2012-12-05 ENCOUNTER — Encounter (HOSPITAL_BASED_OUTPATIENT_CLINIC_OR_DEPARTMENT_OTHER): Payer: Self-pay | Admitting: Orthopedic Surgery

## 2013-09-17 ENCOUNTER — Other Ambulatory Visit: Payer: Self-pay

## 2013-09-17 DIAGNOSIS — Z1231 Encounter for screening mammogram for malignant neoplasm of breast: Secondary | ICD-10-CM

## 2013-10-17 ENCOUNTER — Encounter (INDEPENDENT_AMBULATORY_CARE_PROVIDER_SITE_OTHER): Payer: Self-pay

## 2013-10-17 ENCOUNTER — Ambulatory Visit
Admission: RE | Admit: 2013-10-17 | Discharge: 2013-10-17 | Disposition: A | Discharge status: Home / Self Care | Payer: 59 | Source: Ambulatory Visit

## 2013-10-17 DIAGNOSIS — Z1231 Encounter for screening mammogram for malignant neoplasm of breast: Secondary | ICD-10-CM

## 2014-02-18 ENCOUNTER — Other Ambulatory Visit: Payer: Self-pay | Admitting: Physician Assistant

## 2014-02-18 NOTE — H&P (Signed)
TOTAL KNEE ADMISSION H&P  Patient is being admitted for left total knee arthroplasty.  Subjective:  Chief Complaint:left knee pain.  HPI: Christina Cook, 63 y.o. female, has a history of pain and functional disability in the left knee due to arthritis and has failed non-surgical conservative treatments for greater than 12 weeks to includeNSAID's and/or analgesics, corticosteriod injections, viscosupplementation injections and activity modification.  Onset of symptoms was abrupt, starting 2 years ago with rapidlly worsening course since that time. The patient noted prior procedures on the knee to include  arthroscopy and menisectomy on the left knee(s).  Patient currently rates pain in the left knee(s) at 5 out of 10 with activity. Patient has night pain, worsening of pain with activity and weight bearing, pain that interferes with activities of daily living, pain with passive range of motion, crepitus and joint swelling.  Patient has evidence of subchondral cysts, subchondral sclerosis, periarticular osteophytes and joint space narrowing by imaging studies. There is no active infection.  Patient Active Problem List   Diagnosis Date Noted  . Osteoarthritis of left knee 12/04/2012  . Meniscus, lateral, posterior horn derangement 12/04/2012  . Medial meniscus, posterior horn derangement 12/04/2012   Past Medical History  Diagnosis Date  . Acute medial meniscal injury of left knee   . Hypertension   . Type 2 diabetes mellitus   . Hyperlipidemia   . Anxiety   . History of renal carcinoma     2007--  S/P LEFT NEPHRECTOMY--  NO CHEMORADIATION--  NO RECURRENCE  . Arthritis     Past Surgical History  Procedure Laterality Date  . Laparoscopic left radical nephrectomy  08-16-2005  . Right shoulder open rotator cuff repair  06-05-2007  . Cataract extraction w/ intraocular lens  implant, bilateral    . Knee arthroscopy with medial menisectomy Left 12/04/2012    Procedure: LEFT KNEE ARTHROSCOPY  WITH Partial MEDIAL MENISECTOMY, and Partial Lateral menisectomy, Abrasion chrondroplasty of left medial femoral condyle, Abrasion chrondroplasty of left lateral femoral condyle;  Surgeon: Tobi Bastos, MD;  Location: [facility];  Service: Orthopedics;  Laterality: Left;     (Not in a hospital admission) Allergies  Allergen Reactions  . Dramamine [Dimenhydrinate] Nausea And Vomiting    History  Substance Use Topics  . Smoking status: Never Smoker   . Smokeless tobacco: Never Used  . Alcohol Use: No    No family history on file.   Review of Systems  Constitutional: Negative.   HENT: Negative.   Eyes: Negative.   Respiratory: Negative.   Cardiovascular: Negative.   Gastrointestinal: Negative.   Genitourinary: Negative.   Musculoskeletal: Positive for joint pain.  Skin: Negative.   Neurological: Negative.   Endo/Heme/Allergies: Negative.   Psychiatric/Behavioral: Negative.     Objective:  Physical Exam  Constitutional: She is oriented to person, place, and time. She appears well-developed and well-nourished.  HENT:  Head: Normocephalic and atraumatic.  Eyes: EOM are normal. Pupils are equal, round, and reactive to light.  Neck: Normal range of motion. Neck supple.  Cardiovascular: Normal rate, regular rhythm and normal heart sounds.  Exam reveals no gallop and no friction rub.   No murmur heard. Respiratory: Effort normal and breath sounds normal. No respiratory distress. She has no wheezes. She has no rales.  GI: Soft. Bowel sounds are normal.  Musculoskeletal:  Markedly antalgic gait with varus, left knee.  Some soreness on the right, but not nearly as much as on the left.  The left knee reveals  motion 0 to about 100 degrees.  Marked tibiofemoral and patellofemoral crepitus.  A little bit of varus which is correctable.  Neurological: She is alert and oriented to person, place, and time.  Skin: Skin is warm and dry.  Psychiatric: She has a normal mood  and affect. Her behavior is normal. Judgment and thought content normal.    Vital signs in last 24 hours: @VSRANGES @  Labs:   Estimated body mass index is 43.60 kg/(m^2) as calculated from the following:   Height as of 12/04/12: 5\' 6"  (1.676 m).   Weight as of 12/04/12: 122.471 kg (270 lb).   Imaging Review Plain radiographs demonstrate severe degenerative joint disease of the left knee(s). The overall alignment ismild varus. The bone quality appears to be fair for age and reported activity level.  Assessment/Plan:  End stage arthritis, left knee   The patient history, physical examination, clinical judgment of the provider and imaging studies are consistent with end stage degenerative joint disease of the left knee(s) and total knee arthroplasty is deemed medically necessary. The treatment options including medical management, injection therapy arthroscopy and arthroplasty were discussed at length. The risks and benefits of total knee arthroplasty were presented and reviewed. The risks due to aseptic loosening, infection, stiffness, patella tracking problems, thromboembolic complications and other imponderables were discussed. The patient acknowledged the explanation, agreed to proceed with the plan and consent was signed. Patient is being admitted for inpatient treatment for surgery, pain control, PT, OT, prophylactic antibiotics, VTE prophylaxis, progressive ambulation and ADL's and discharge planning. The patient is planning to be discharged home with home health services

## 2014-02-19 ENCOUNTER — Encounter (HOSPITAL_COMMUNITY): Payer: Self-pay | Admitting: Pharmacy Technician

## 2014-02-24 NOTE — Pre-Procedure Instructions (Signed)
DAROLYN DOUBLE  02/24/2014   Your procedure is scheduled on:  Wednesday, Nov. 4th   Report to [facility] Admitting at  8:50 AM.  Call this number if you have problems the morning of surgery: [phone]   Remember:   Do not eat food or drink liquids after midnight Tuesday.   Take these medicines the morning of surgery with A SIP OF WATER: lexapro   Do not wear jewelry, make-up or nail polish.  Do not wear lotions, powders, or perfumes. You may NOT wear deodorant the morning of surgery.  Do not shave underarms & legs 48 hours prior to surgery.    Do not bring valuables to the hospital.  [facility] is not responsible for any belongings or valuables.               Contacts, dentures or bridgework may not be worn into surgery.  Leave suitcase in the car. After surgery it may be brought to your room.  For patients admitted to the hospital, discharge time is determined by your treatment team.    Name and phone number of your driver:    Special Instructions: "Preparing for Surgery" instruction sheet.   Please read over the following fact sheets that you were given: Pain Booklet, Coughing and Deep Breathing, Blood Transfusion Information, MRSA Information and Surgical Site Infection Prevention

## 2014-02-25 ENCOUNTER — Encounter (HOSPITAL_COMMUNITY): Payer: Self-pay

## 2014-02-25 ENCOUNTER — Encounter (HOSPITAL_COMMUNITY)
Admission: RE | Admit: 2014-02-25 | Discharge: 2014-02-25 | Disposition: A | Discharge status: Home / Self Care | Payer: 59 | Source: Ambulatory Visit | Attending: Orthopedic Surgery | Admitting: Orthopedic Surgery

## 2014-02-25 ENCOUNTER — Encounter (HOSPITAL_COMMUNITY)
Admission: RE | Admit: 2014-02-25 | Discharge: 2014-02-25 | Disposition: A | Discharge status: Home / Self Care | Payer: 59 | Source: Ambulatory Visit | Attending: Physician Assistant | Admitting: Physician Assistant

## 2014-02-25 DIAGNOSIS — M179 Osteoarthritis of knee, unspecified: Secondary | ICD-10-CM | POA: Diagnosis not present

## 2014-02-25 DIAGNOSIS — Z01818 Encounter for other preprocedural examination: Secondary | ICD-10-CM | POA: Insufficient documentation

## 2014-02-25 DIAGNOSIS — E119 Type 2 diabetes mellitus without complications: Secondary | ICD-10-CM | POA: Insufficient documentation

## 2014-02-25 DIAGNOSIS — I771 Stricture of artery: Secondary | ICD-10-CM | POA: Insufficient documentation

## 2014-02-25 DIAGNOSIS — I10 Essential (primary) hypertension: Secondary | ICD-10-CM | POA: Insufficient documentation

## 2014-02-25 HISTORY — DX: Unspecified amblyopia, bilateral: H53.003

## 2014-02-25 LAB — CBC WITH DIFFERENTIAL/PLATELET
Basophils Absolute: 0 10*3/uL (ref 0.0–0.1)
Basophils Relative: 0 % (ref 0–1)
Eosinophils Absolute: 0.2 10*3/uL (ref 0.0–0.7)
Eosinophils Relative: 2 % (ref 0–5)
HCT: 37.8 % (ref 36.0–46.0)
HEMOGLOBIN: 12.4 g/dL (ref 12.0–15.0)
Lymphocytes Relative: 22 % (ref 12–46)
Lymphs Abs: 2.4 10*3/uL (ref 0.7–4.0)
MCH: 28.3 pg (ref 26.0–34.0)
MCHC: 32.8 g/dL (ref 30.0–36.0)
MCV: 86.3 fL (ref 78.0–100.0)
MONOS PCT: 6 % (ref 3–12)
Monocytes Absolute: 0.7 10*3/uL (ref 0.1–1.0)
NEUTROS ABS: 7.6 10*3/uL (ref 1.7–7.7)
NEUTROS PCT: 70 % (ref 43–77)
Platelets: 340 10*3/uL (ref 150–400)
RBC: 4.38 MIL/uL (ref 3.87–5.11)
RDW: 13.5 % (ref 11.5–15.5)
WBC: 10.8 10*3/uL — AB (ref 4.0–10.5)

## 2014-02-25 LAB — URINE MICROSCOPIC-ADD ON

## 2014-02-25 LAB — COMPREHENSIVE METABOLIC PANEL
ALBUMIN: 4 g/dL (ref 3.5–5.2)
ALK PHOS: 87 U/L (ref 39–117)
ALT: 42 U/L — ABNORMAL HIGH (ref 0–35)
ANION GAP: 16 — AB (ref 5–15)
AST: 43 U/L — ABNORMAL HIGH (ref 0–37)
BUN: 19 mg/dL (ref 6–23)
CHLORIDE: 98 meq/L (ref 96–112)
CO2: 24 mEq/L (ref 19–32)
Calcium: 10 mg/dL (ref 8.4–10.5)
Creatinine, Ser: 1.07 mg/dL (ref 0.50–1.10)
GFR calc Af Amer: 64 mL/min — ABNORMAL LOW (ref >90)
GFR calc non Af Amer: 55 mL/min — ABNORMAL LOW (ref >90)
GLUCOSE: 143 mg/dL — AB (ref 70–99)
POTASSIUM: 4 meq/L (ref 3.7–5.3)
Sodium: 138 mEq/L (ref 137–147)
Total Bilirubin: 0.3 mg/dL (ref 0.3–1.2)
Total Protein: 7.7 g/dL (ref 6.0–8.3)

## 2014-02-25 LAB — URINALYSIS, ROUTINE W REFLEX MICROSCOPIC
Glucose, UA: NEGATIVE mg/dL
HGB URINE DIPSTICK: NEGATIVE
Ketones, ur: 15 mg/dL — AB
NITRITE: NEGATIVE
Protein, ur: NEGATIVE mg/dL
SPECIFIC GRAVITY, URINE: 1.023 (ref 1.005–1.030)
UROBILINOGEN UA: 0.2 mg/dL (ref 0.0–1.0)
pH: 5.5 (ref 5.0–8.0)

## 2014-02-25 LAB — TYPE AND SCREEN
ABO/RH(D): O POS
ANTIBODY SCREEN: NEGATIVE

## 2014-02-25 LAB — PROTIME-INR
INR: 0.94 (ref 0.00–1.49)
Prothrombin Time: 12.6 seconds (ref 11.6–15.2)

## 2014-02-25 LAB — APTT: APTT: 31 s (ref 24–37)

## 2014-02-25 LAB — ABO/RH: ABO/RH(D): O POS

## 2014-02-25 LAB — SURGICAL PCR SCREEN
MRSA, PCR: NEGATIVE
STAPHYLOCOCCUS AUREUS: NEGATIVE

## 2014-02-25 NOTE — Progress Notes (Signed)
02/25/14 0952  OBSTRUCTIVE SLEEP APNEA  Have you ever been diagnosed with sleep apnea through a sleep study? No  Do you snore loudly (loud enough to be heard through closed doors)?  0  Do you often feel tired, fatigued, or sleepy during the daytime? 1 (gets up 4-5 x a night to urinate)  Has anyone observed you stop breathing during your sleep? 0  Do you have, or are you being treated for high blood pressure? 1  BMI more than 35 kg/m2? 1  Age over 75 years old? 1  Neck circumference greater than 40 cm/16 inches? 0  Gender: 0  Obstructive Sleep Apnea Score 4  Score 4 or greater  Results sent to PCP

## 2014-02-25 NOTE — Progress Notes (Addendum)
No cardiac issues, never had testing, never has seen a cardiologist.  DA Spoke with Mendel Ryder at office, regarding UA & liver enyzme results.  DA

## 2014-02-26 LAB — URINE CULTURE

## 2014-03-04 MED ORDER — CHLORHEXIDINE GLUCONATE 4 % EX LIQD
60.0000 mL | Freq: Once | CUTANEOUS | Status: DC
  Filled 2014-03-04: qty 60

## 2014-03-04 MED ORDER — DEXTROSE 5 % IV SOLN
3.0000 g | INTRAVENOUS | Status: AC
  Administered 2014-03-05: 3 g via INTRAVENOUS
  Filled 2014-03-04: qty 3000

## 2014-03-04 NOTE — Progress Notes (Signed)
Patient called with time change. Message left on machine to arrive at [phone]

## 2014-03-05 ENCOUNTER — Inpatient Hospital Stay (HOSPITAL_COMMUNITY): Payer: 59

## 2014-03-05 ENCOUNTER — Inpatient Hospital Stay (HOSPITAL_COMMUNITY): Payer: 59 | Admitting: Anesthesiology

## 2014-03-05 ENCOUNTER — Inpatient Hospital Stay (HOSPITAL_COMMUNITY): Payer: 59 | Admitting: Vascular Surgery

## 2014-03-05 ENCOUNTER — Encounter (HOSPITAL_COMMUNITY): Payer: Self-pay | Admitting: Certified Registered"

## 2014-03-05 ENCOUNTER — Inpatient Hospital Stay (HOSPITAL_COMMUNITY)
Admission: RE | Admit: 2014-03-05 | Discharge: 2014-03-07 | DRG: 470 | Disposition: A | Discharge status: Home Health | Payer: 59 | Source: Ambulatory Visit | Attending: Orthopedic Surgery | Admitting: Orthopedic Surgery

## 2014-03-05 ENCOUNTER — Encounter (HOSPITAL_COMMUNITY)
Admission: RE | Disposition: A | Discharge status: Home Health | Payer: Self-pay | Source: Ambulatory Visit | Attending: Orthopedic Surgery

## 2014-03-05 DIAGNOSIS — E119 Type 2 diabetes mellitus without complications: Secondary | ICD-10-CM | POA: Diagnosis present

## 2014-03-05 DIAGNOSIS — Z96652 Presence of left artificial knee joint: Secondary | ICD-10-CM

## 2014-03-05 DIAGNOSIS — I10 Essential (primary) hypertension: Secondary | ICD-10-CM | POA: Diagnosis present

## 2014-03-05 DIAGNOSIS — M1710 Unilateral primary osteoarthritis, unspecified knee: Secondary | ICD-10-CM | POA: Diagnosis present

## 2014-03-05 DIAGNOSIS — M170 Bilateral primary osteoarthritis of knee: Secondary | ICD-10-CM | POA: Diagnosis present

## 2014-03-05 DIAGNOSIS — Z96659 Presence of unspecified artificial knee joint: Secondary | ICD-10-CM

## 2014-03-05 DIAGNOSIS — G8918 Other acute postprocedural pain: Secondary | ICD-10-CM | POA: Diagnosis not present

## 2014-03-05 DIAGNOSIS — Z905 Acquired absence of kidney: Secondary | ICD-10-CM | POA: Diagnosis present

## 2014-03-05 DIAGNOSIS — M179 Osteoarthritis of knee, unspecified: Secondary | ICD-10-CM | POA: Diagnosis present

## 2014-03-05 DIAGNOSIS — Z7901 Long term (current) use of anticoagulants: Secondary | ICD-10-CM

## 2014-03-05 DIAGNOSIS — Z79899 Other long term (current) drug therapy: Secondary | ICD-10-CM | POA: Diagnosis not present

## 2014-03-05 DIAGNOSIS — Z85528 Personal history of other malignant neoplasm of kidney: Secondary | ICD-10-CM

## 2014-03-05 DIAGNOSIS — E785 Hyperlipidemia, unspecified: Secondary | ICD-10-CM | POA: Diagnosis present

## 2014-03-05 DIAGNOSIS — F419 Anxiety disorder, unspecified: Secondary | ICD-10-CM | POA: Diagnosis present

## 2014-03-05 DIAGNOSIS — D62 Acute posthemorrhagic anemia: Secondary | ICD-10-CM | POA: Diagnosis not present

## 2014-03-05 DIAGNOSIS — M25562 Pain in left knee: Secondary | ICD-10-CM | POA: Diagnosis present

## 2014-03-05 HISTORY — PX: TOTAL KNEE ARTHROPLASTY: SHX125

## 2014-03-05 LAB — GLUCOSE, CAPILLARY
GLUCOSE-CAPILLARY: 165 mg/dL — AB (ref 70–99)
Glucose-Capillary: 120 mg/dL — ABNORMAL HIGH (ref 70–99)
Glucose-Capillary: 157 mg/dL — ABNORMAL HIGH (ref 70–99)
Glucose-Capillary: 191 mg/dL — ABNORMAL HIGH (ref 70–99)

## 2014-03-05 LAB — CBC
HEMATOCRIT: 34.2 % — AB (ref 36.0–46.0)
Hemoglobin: 11.2 g/dL — ABNORMAL LOW (ref 12.0–15.0)
MCH: 27.7 pg (ref 26.0–34.0)
MCHC: 32.7 g/dL (ref 30.0–36.0)
MCV: 84.4 fL (ref 78.0–100.0)
Platelets: 281 10*3/uL (ref 150–400)
RBC: 4.05 MIL/uL (ref 3.87–5.11)
RDW: 13.4 % (ref 11.5–15.5)
WBC: 17 10*3/uL — AB (ref 4.0–10.5)

## 2014-03-05 LAB — CREATININE, SERUM
Creatinine, Ser: 1.14 mg/dL — ABNORMAL HIGH (ref 0.50–1.10)
GFR, EST AFRICAN AMERICAN: 59 mL/min — AB (ref >90)
GFR, EST NON AFRICAN AMERICAN: 51 mL/min — AB (ref >90)

## 2014-03-05 SURGERY — ARTHROPLASTY, KNEE, TOTAL
Anesthesia: General | Site: Knee | Laterality: Left

## 2014-03-05 MED ORDER — SODIUM CHLORIDE 0.9 % IR SOLN
Status: DC | PRN
  Administered 2014-03-05 (×2): 1000 mL

## 2014-03-05 MED ORDER — ENOXAPARIN SODIUM 30 MG/0.3ML ~~LOC~~ SOLN
30.0000 mg | Freq: Two times a day (BID) | SUBCUTANEOUS | Status: DC
  Administered 2014-03-06 – 2014-03-07 (×3): 30 mg via SUBCUTANEOUS
  Filled 2014-03-05 (×5): qty 0.3

## 2014-03-05 MED ORDER — DOCUSATE SODIUM 100 MG PO CAPS
100.0000 mg | ORAL_CAPSULE | Freq: Two times a day (BID) | ORAL | Status: DC
  Administered 2014-03-06 – 2014-03-07 (×3): 100 mg via ORAL
  Filled 2014-03-05 (×4): qty 1

## 2014-03-05 MED ORDER — PROPOFOL 10 MG/ML IV BOLUS
INTRAVENOUS | Status: AC
  Filled 2014-03-05: qty 20

## 2014-03-05 MED ORDER — LOSARTAN POTASSIUM 50 MG PO TABS
100.0000 mg | ORAL_TABLET | Freq: Every day | ORAL | Status: DC
  Administered 2014-03-05 – 2014-03-07 (×2): 100 mg via ORAL
  Filled 2014-03-05 (×3): qty 2

## 2014-03-05 MED ORDER — ONDANSETRON HCL 4 MG PO TABS: 4.0000 mg | ORAL_TABLET | Freq: Four times a day (QID) | ORAL | Status: DC | PRN

## 2014-03-05 MED ORDER — FENTANYL CITRATE 0.05 MG/ML IJ SOLN
INTRAMUSCULAR | Status: AC
  Filled 2014-03-05: qty 5

## 2014-03-05 MED ORDER — BUPIVACAINE HCL (PF) 0.25 % IJ SOLN
INTRAMUSCULAR | Status: AC
  Filled 2014-03-05: qty 30

## 2014-03-05 MED ORDER — MIDAZOLAM HCL 2 MG/2ML IJ SOLN
INTRAMUSCULAR | Status: AC
  Filled 2014-03-05: qty 2

## 2014-03-05 MED ORDER — LIDOCAINE HCL (CARDIAC) 20 MG/ML IV SOLN
INTRAVENOUS | Status: AC
  Filled 2014-03-05: qty 5

## 2014-03-05 MED ORDER — OXYCODONE-ACETAMINOPHEN 5-325 MG PO TABS: 1.0000 | ORAL_TABLET | ORAL | Status: DC | PRN

## 2014-03-05 MED ORDER — BUPIVACAINE LIPOSOME 1.3 % IJ SUSP
20.0000 mL | Freq: Once | INTRAMUSCULAR | Status: DC
  Filled 2014-03-05: qty 20

## 2014-03-05 MED ORDER — ACETAMINOPHEN 325 MG PO TABS: 650.0000 mg | ORAL_TABLET | Freq: Four times a day (QID) | ORAL | Status: DC | PRN

## 2014-03-05 MED ORDER — OXYCODONE HCL 5 MG PO TABS
5.0000 mg | ORAL_TABLET | ORAL | Status: DC | PRN
  Administered 2014-03-05 – 2014-03-06 (×3): 10 mg via ORAL
  Administered 2014-03-06 (×3): 5 mg via ORAL
  Administered 2014-03-06: 10 mg via ORAL
  Administered 2014-03-07 (×3): 5 mg via ORAL
  Filled 2014-03-05 (×3): qty 1
  Filled 2014-03-05 (×2): qty 2
  Filled 2014-03-05: qty 1
  Filled 2014-03-05 (×2): qty 2
  Filled 2014-03-05 (×2): qty 1
  Filled 2014-03-05: qty 2

## 2014-03-05 MED ORDER — CEFAZOLIN SODIUM-DEXTROSE 2-3 GM-% IV SOLR
2.0000 g | Freq: Four times a day (QID) | INTRAVENOUS | Status: AC
  Administered 2014-03-05 (×2): 2 g via INTRAVENOUS
  Filled 2014-03-05 (×2): qty 50

## 2014-03-05 MED ORDER — PHENOL 1.4 % MT LIQD: 1.0000 | OROMUCOSAL | Status: DC | PRN

## 2014-03-05 MED ORDER — PROPOFOL 10 MG/ML IV BOLUS
INTRAVENOUS | Status: DC | PRN
  Administered 2014-03-05: 200 mg via INTRAVENOUS
  Administered 2014-03-05 (×2): 100 mg via INTRAVENOUS

## 2014-03-05 MED ORDER — LIDOCAINE HCL (CARDIAC) 20 MG/ML IV SOLN
INTRAVENOUS | Status: DC | PRN
  Administered 2014-03-05: 40 mg via INTRAVENOUS
  Administered 2014-03-05: 60 mg via INTRAVENOUS

## 2014-03-05 MED ORDER — SUCCINYLCHOLINE CHLORIDE 20 MG/ML IJ SOLN
INTRAMUSCULAR | Status: AC
  Filled 2014-03-05: qty 1

## 2014-03-05 MED ORDER — VITAMIN B-6 100 MG PO TABS
100.0000 mg | ORAL_TABLET | Freq: Every day | ORAL | Status: DC
Start: 2014-03-05 — End: 2014-03-07
  Administered 2014-03-07: 100 mg via ORAL
  Filled 2014-03-05 (×3): qty 1

## 2014-03-05 MED ORDER — HYDROMORPHONE HCL 1 MG/ML IJ SOLN
INTRAMUSCULAR | Status: AC
  Filled 2014-03-05: qty 1

## 2014-03-05 MED ORDER — POTASSIUM CHLORIDE IN NACL 20-0.9 MEQ/L-% IV SOLN
INTRAVENOUS | Status: DC
  Administered 2014-03-05 – 2014-03-06 (×2): via INTRAVENOUS
  Filled 2014-03-05 (×6): qty 1000

## 2014-03-05 MED ORDER — ACETAMINOPHEN 10 MG/ML IV SOLN
INTRAVENOUS | Status: DC | PRN
  Administered 2014-03-05: 1000 mg via INTRAVENOUS

## 2014-03-05 MED ORDER — SODIUM CHLORIDE 0.9 % IJ SOLN
INTRAMUSCULAR | Status: DC | PRN
  Administered 2014-03-05: 40 mL

## 2014-03-05 MED ORDER — HYDROMORPHONE HCL 1 MG/ML IJ SOLN
0.5000 mg | INTRAMUSCULAR | Status: DC | PRN
  Administered 2014-03-05 – 2014-03-06 (×7): 1 mg via INTRAVENOUS
  Filled 2014-03-05 (×7): qty 1

## 2014-03-05 MED ORDER — ONDANSETRON HCL 4 MG/2ML IJ SOLN
INTRAMUSCULAR | Status: DC | PRN
  Administered 2014-03-05: 4 mg via INTRAVENOUS

## 2014-03-05 MED ORDER — LOSARTAN POTASSIUM-HCTZ 100-25 MG PO TABS: 1.0000 | ORAL_TABLET | Freq: Every morning | ORAL | Status: DC

## 2014-03-05 MED ORDER — ENOXAPARIN SODIUM 30 MG/0.3ML ~~LOC~~ SOLN
30.0000 mg | Freq: Two times a day (BID) | SUBCUTANEOUS | Status: DC
Start: 2014-03-05 — End: 2014-11-10

## 2014-03-05 MED ORDER — MENTHOL 3 MG MT LOZG
1.0000 | LOZENGE | OROMUCOSAL | Status: DC | PRN
  Filled 2014-03-05: qty 9

## 2014-03-05 MED ORDER — ACETAMINOPHEN 650 MG RE SUPP: 650.0000 mg | Freq: Four times a day (QID) | RECTAL | Status: DC | PRN

## 2014-03-05 MED ORDER — ONDANSETRON HCL 4 MG/2ML IJ SOLN
INTRAMUSCULAR | Status: AC
  Filled 2014-03-05: qty 2

## 2014-03-05 MED ORDER — BISACODYL 5 MG PO TBEC: 5.0000 mg | DELAYED_RELEASE_TABLET | Freq: Every day | ORAL | Status: DC | PRN

## 2014-03-05 MED ORDER — FENTANYL CITRATE 0.05 MG/ML IJ SOLN
INTRAMUSCULAR | Status: DC | PRN
  Administered 2014-03-05: 100 ug via INTRAVENOUS
  Administered 2014-03-05: 50 ug via INTRAVENOUS
  Administered 2014-03-05: 100 ug via INTRAVENOUS
  Administered 2014-03-05 (×2): 50 ug via INTRAVENOUS
  Administered 2014-03-05: 200 ug via INTRAVENOUS
  Administered 2014-03-05: 50 ug via INTRAVENOUS

## 2014-03-05 MED ORDER — ESCITALOPRAM OXALATE 10 MG PO TABS
10.0000 mg | ORAL_TABLET | Freq: Every day | ORAL | Status: DC
  Administered 2014-03-05 – 2014-03-07 (×3): 10 mg via ORAL
  Filled 2014-03-05 (×3): qty 1

## 2014-03-05 MED ORDER — ONDANSETRON HCL 4 MG PO TABS: 4.0000 mg | ORAL_TABLET | Freq: Three times a day (TID) | ORAL | Status: DC | PRN

## 2014-03-05 MED ORDER — METFORMIN HCL 500 MG PO TABS
1000.0000 mg | ORAL_TABLET | Freq: Two times a day (BID) | ORAL | Status: DC
  Administered 2014-03-05 – 2014-03-07 (×4): 1000 mg via ORAL
  Filled 2014-03-05 (×6): qty 2

## 2014-03-05 MED ORDER — BUPIVACAINE HCL (PF) 0.25 % IJ SOLN
INTRAMUSCULAR | Status: DC | PRN
  Administered 2014-03-05: 20 mL

## 2014-03-05 MED ORDER — FELODIPINE ER 10 MG PO TB24
10.0000 mg | ORAL_TABLET | Freq: Every morning | ORAL | Status: DC
  Administered 2014-03-05 – 2014-03-07 (×2): 10 mg via ORAL
  Filled 2014-03-05 (×3): qty 1

## 2014-03-05 MED ORDER — METOCLOPRAMIDE HCL 5 MG/ML IJ SOLN: 5.0000 mg | Freq: Three times a day (TID) | INTRAMUSCULAR | Status: DC | PRN

## 2014-03-05 MED ORDER — METOCLOPRAMIDE HCL 10 MG PO TABS: 5.0000 mg | ORAL_TABLET | Freq: Three times a day (TID) | ORAL | Status: DC | PRN

## 2014-03-05 MED ORDER — DEXTROSE 5 % IV SOLN
500.0000 mg | Freq: Four times a day (QID) | INTRAVENOUS | Status: DC | PRN
  Filled 2014-03-05: qty 5

## 2014-03-05 MED ORDER — HYDROMORPHONE HCL 1 MG/ML IJ SOLN
0.2500 mg | INTRAMUSCULAR | Status: DC | PRN
  Administered 2014-03-05 (×4): 0.5 mg via INTRAVENOUS

## 2014-03-05 MED ORDER — DEXAMETHASONE SODIUM PHOSPHATE 4 MG/ML IJ SOLN
INTRAMUSCULAR | Status: DC | PRN
  Administered 2014-03-05: 8 mg via INTRAVENOUS

## 2014-03-05 MED ORDER — MIDAZOLAM HCL 5 MG/5ML IJ SOLN
INTRAMUSCULAR | Status: DC | PRN
  Administered 2014-03-05 (×2): 2 mg via INTRAVENOUS

## 2014-03-05 MED ORDER — BUPIVACAINE LIPOSOME 1.3 % IJ SUSP
INTRAMUSCULAR | Status: DC | PRN
  Administered 2014-03-05: 20 mL

## 2014-03-05 MED ORDER — SIMVASTATIN 20 MG PO TABS
20.0000 mg | ORAL_TABLET | Freq: Every evening | ORAL | Status: DC
  Administered 2014-03-05 – 2014-03-06 (×2): 20 mg via ORAL
  Filled 2014-03-05 (×3): qty 1

## 2014-03-05 MED ORDER — ONDANSETRON HCL 4 MG/2ML IJ SOLN: 4.0000 mg | Freq: Once | INTRAMUSCULAR | Status: DC | PRN

## 2014-03-05 MED ORDER — PHENYLEPHRINE HCL 10 MG/ML IJ SOLN
10.0000 mg | INTRAVENOUS | Status: DC | PRN
  Administered 2014-03-05: 80 ug/min via INTRAVENOUS

## 2014-03-05 MED ORDER — SULFAMETHOXAZOLE-TRIMETHOPRIM 400-80 MG PO TABS
1.0000 | ORAL_TABLET | Freq: Two times a day (BID) | ORAL | Status: DC
  Administered 2014-03-05 – 2014-03-07 (×5): 1 via ORAL
  Filled 2014-03-05 (×6): qty 1

## 2014-03-05 MED ORDER — METHOCARBAMOL 500 MG PO TABS
500.0000 mg | ORAL_TABLET | Freq: Four times a day (QID) | ORAL | Status: DC | PRN
  Administered 2014-03-05 – 2014-03-07 (×5): 500 mg via ORAL
  Filled 2014-03-05 (×6): qty 1

## 2014-03-05 MED ORDER — SUCCINYLCHOLINE CHLORIDE 20 MG/ML IJ SOLN
INTRAMUSCULAR | Status: DC | PRN
  Administered 2014-03-05: 100 mg via INTRAVENOUS

## 2014-03-05 MED ORDER — INSULIN ASPART 100 UNIT/ML ~~LOC~~ SOLN
0.0000 [IU] | Freq: Three times a day (TID) | SUBCUTANEOUS | Status: DC
  Administered 2014-03-05: 2 [IU] via SUBCUTANEOUS
  Administered 2014-03-06 (×2): 1 [IU] via SUBCUTANEOUS

## 2014-03-05 MED ORDER — DEXAMETHASONE SODIUM PHOSPHATE 4 MG/ML IJ SOLN
INTRAMUSCULAR | Status: AC
  Filled 2014-03-05: qty 2

## 2014-03-05 MED ORDER — LACTATED RINGERS IV SOLN
INTRAVENOUS | Status: DC
  Administered 2014-03-05 (×2): via INTRAVENOUS

## 2014-03-05 MED ORDER — ONDANSETRON HCL 4 MG/2ML IJ SOLN: 4.0000 mg | Freq: Four times a day (QID) | INTRAMUSCULAR | Status: DC | PRN

## 2014-03-05 MED ORDER — HYDROCHLOROTHIAZIDE 25 MG PO TABS
25.0000 mg | ORAL_TABLET | Freq: Every day | ORAL | Status: DC
  Administered 2014-03-05 – 2014-03-07 (×2): 25 mg via ORAL
  Filled 2014-03-05 (×3): qty 1

## 2014-03-05 MED ORDER — ALUM & MAG HYDROXIDE-SIMETH 200-200-20 MG/5ML PO SUSP: 30.0000 mL | ORAL | Status: DC | PRN

## 2014-03-05 MED ORDER — ACETAMINOPHEN 10 MG/ML IV SOLN
INTRAVENOUS | Status: AC
  Filled 2014-03-05: qty 100

## 2014-03-05 MED ORDER — METHOCARBAMOL 500 MG PO TABS: 500.0000 mg | ORAL_TABLET | Freq: Four times a day (QID) | ORAL | Status: DC

## 2014-03-05 SURGICAL SUPPLY — 73 items
APL SKNCLS STERI-STRIP NONHPOA (GAUZE/BANDAGES/DRESSINGS) ×1
BANDAGE ELASTIC 4 VELCRO ST LF (GAUZE/BANDAGES/DRESSINGS) ×3 IMPLANT
BANDAGE ELASTIC 6 VELCRO ST LF (GAUZE/BANDAGES/DRESSINGS) ×3 IMPLANT
BANDAGE ESMARK 6X9 LF (GAUZE/BANDAGES/DRESSINGS) ×1 IMPLANT
BENZOIN TINCTURE PRP APPL 2/3 (GAUZE/BANDAGES/DRESSINGS) ×3 IMPLANT
BLADE SAG 18X100X1.27 (BLADE) ×6 IMPLANT
BNDG CMPR 9X6 STRL LF SNTH (GAUZE/BANDAGES/DRESSINGS) ×1
BNDG ESMARK 6X9 LF (GAUZE/BANDAGES/DRESSINGS) ×3
BOWL SMART MIX CTS (DISPOSABLE) ×3 IMPLANT
CEMENT BONE SIMPLEX SPEEDSET (Cement) ×6 IMPLANT
CLOSURE STERI-STRIP 1/2X4 (GAUZE/BANDAGES/DRESSINGS) ×1
CLOSURE WOUND 1/2 X4 (GAUZE/BANDAGES/DRESSINGS) ×2
CLSR STERI-STRIP ANTIMIC 1/2X4 (GAUZE/BANDAGES/DRESSINGS) ×2 IMPLANT
COVER SURGICAL LIGHT HANDLE (MISCELLANEOUS) ×3 IMPLANT
CUFF TOURNIQUET SINGLE 34IN LL (TOURNIQUET CUFF) ×3 IMPLANT
DRAPE EXTREMITY T 121X128X90 (DRAPE) ×3 IMPLANT
DRAPE IMP U-DRAPE 54X76 (DRAPES) ×3 IMPLANT
DRAPE PROXIMA HALF (DRAPES) ×3 IMPLANT
DRAPE U-SHAPE 47X51 STRL (DRAPES) ×3 IMPLANT
DRSG PAD ABDOMINAL 8X10 ST (GAUZE/BANDAGES/DRESSINGS) ×3 IMPLANT
DURAPREP 26ML APPLICATOR (WOUND CARE) ×6 IMPLANT
ELECT CAUTERY BLADE 6.4 (BLADE) ×3 IMPLANT
ELECT REM PT RETURN 9FT ADLT (ELECTROSURGICAL) ×3
ELECTRODE REM PT RTRN 9FT ADLT (ELECTROSURGICAL) ×1 IMPLANT
EVACUATOR 1/8 PVC DRAIN (DRAIN) ×3 IMPLANT
FACESHIELD WRAPAROUND (MASK) ×6 IMPLANT
FACESHIELD WRAPAROUND OR TEAM (MASK) ×2 IMPLANT
GAUZE SPONGE 4X4 12PLY STRL (GAUZE/BANDAGES/DRESSINGS) ×3 IMPLANT
GLOVE BIO SURGEON STRL SZ 6 (GLOVE) ×2 IMPLANT
GLOVE BIOGEL PI IND STRL 6 (GLOVE) IMPLANT
GLOVE BIOGEL PI IND STRL 7.0 (GLOVE) ×2 IMPLANT
GLOVE BIOGEL PI INDICATOR 6 (GLOVE) ×2
GLOVE BIOGEL PI INDICATOR 7.0 (GLOVE) ×4
GLOVE ECLIPSE 6.5 STRL STRAW (GLOVE) ×6 IMPLANT
GLOVE ORTHO TXT STRL SZ7.5 (GLOVE) ×3 IMPLANT
GOWN STRL REUS W/ TWL LRG LVL3 (GOWN DISPOSABLE) ×1 IMPLANT
GOWN STRL REUS W/ TWL XL LVL3 (GOWN DISPOSABLE) ×1 IMPLANT
GOWN STRL REUS W/TWL LRG LVL3 (GOWN DISPOSABLE) ×9
GOWN STRL REUS W/TWL XL LVL3 (GOWN DISPOSABLE) ×3
HANDPIECE INTERPULSE COAX TIP (DISPOSABLE) ×3
IMMOBILIZER KNEE 22 UNIV (SOFTGOODS) ×3 IMPLANT
IMMOBILIZER KNEE 24 THIGH 36 (MISCELLANEOUS) IMPLANT
IMMOBILIZER KNEE 24 UNIV (MISCELLANEOUS)
KIT BASIN OR (CUSTOM PROCEDURE TRAY) ×3 IMPLANT
KIT ROOM TURNOVER OR (KITS) ×3 IMPLANT
KNEE/VIT E POLY LINER LEVEL 1B ×3 IMPLANT
MANIFOLD NEPTUNE II (INSTRUMENTS) ×3 IMPLANT
NDL 18GX1X1/2 (RX/OR ONLY) (NEEDLE) ×1 IMPLANT
NDL 25GX 5/8IN NON SAFETY (NEEDLE) ×1 IMPLANT
NEEDLE 18GX1X1/2 (RX/OR ONLY) (NEEDLE) ×3 IMPLANT
NEEDLE 25GX 5/8IN NON SAFETY (NEEDLE) ×3 IMPLANT
NS IRRIG 1000ML POUR BTL (IV SOLUTION) ×3 IMPLANT
PACK TOTAL JOINT (CUSTOM PROCEDURE TRAY) ×3 IMPLANT
PACK UNIVERSAL I (CUSTOM PROCEDURE TRAY) ×3 IMPLANT
PAD ARMBOARD 7.5X6 YLW CONV (MISCELLANEOUS) ×6 IMPLANT
PAD CAST 4YDX4 CTTN HI CHSV (CAST SUPPLIES) ×1 IMPLANT
PADDING CAST COTTON 4X4 STRL (CAST SUPPLIES) ×3
PADDING CAST COTTON 6X4 STRL (CAST SUPPLIES) ×3 IMPLANT
SET HNDPC FAN SPRY TIP SCT (DISPOSABLE) ×1 IMPLANT
STRIP CLOSURE SKIN 1/2X4 (GAUZE/BANDAGES/DRESSINGS) ×4 IMPLANT
SUCTION FRAZIER TIP 10 FR DISP (SUCTIONS) ×3 IMPLANT
SUT MNCRL AB 4-0 PS2 18 (SUTURE) ×3 IMPLANT
SUT VIC AB 0 CT1 27 (SUTURE) ×6
SUT VIC AB 0 CT1 27XBRD ANBCTR (SUTURE) IMPLANT
SUT VIC AB 1 CT1 27 (SUTURE) ×6
SUT VIC AB 1 CT1 27XBRD ANBCTR (SUTURE) ×2 IMPLANT
SUT VIC AB 2-0 CT1 27 (SUTURE) ×6
SUT VIC AB 2-0 CT1 TAPERPNT 27 (SUTURE) ×2 IMPLANT
SYR 50ML LL SCALE MARK (SYRINGE) ×3 IMPLANT
SYR CONTROL 10ML LL (SYRINGE) ×3 IMPLANT
TOWEL OR 17X24 6PK STRL BLUE (TOWEL DISPOSABLE) ×3 IMPLANT
TOWEL OR 17X26 10 PK STRL BLUE (TOWEL DISPOSABLE) ×3 IMPLANT
WATER STERILE IRR 1000ML POUR (IV SOLUTION) ×2 IMPLANT

## 2014-03-05 NOTE — Anesthesia Procedure Notes (Signed)
Procedure Name: Intubation Date/Time: 03/05/2014 9:49 AM Performed by: Melina Copa, Simrit Gohlke R Pre-anesthesia Checklist: Patient identified, Emergency Drugs available, Suction available, Patient being monitored and Timeout performed Patient Re-evaluated:Patient Re-evaluated prior to inductionOxygen Delivery Method: Circle system utilized Preoxygenation: Pre-oxygenation with 100% oxygen Intubation Type: IV induction Ventilation: Mask ventilation without difficulty Laryngoscope Size: Mac and 3 Grade View: Grade II Tube type: Oral Tube size: 7.5 mm Number of attempts: 1 Airway Equipment and Method: Stylet Placement Confirmation: ETT inserted through vocal cords under direct vision and positive ETCO2 Secured at: 22 cm Tube secured with: Tape Dental Injury: Teeth and Oropharynx as per pre-operative assessment

## 2014-03-05 NOTE — Progress Notes (Signed)
Orthopedic Tech Progress Note Patient Details:  Christina Cook 07/28/1950 [id number]  Ortho Devices Ortho Device/Splint Interventions: Application   [name] 03/05/2014, 12:33 PM

## 2014-03-05 NOTE — Progress Notes (Signed)
Orthopedic Tech Progress Note Patient Details:  Christina Cook 12/01/1950 [id number]  CPM Left Knee CPM Left Knee: On Left Knee Flexion (Degrees): 90 Left Knee Extension (Degrees): 0 Additional Comments: trapeze bar patient helper When bone foam becomes available, it will be provided;RN notified  [name] 03/05/2014, 12:34 PM

## 2014-03-05 NOTE — Plan of Care (Signed)
Problem: Phase I Progression Outcomes Goal: CMS/Neurovascular status WDL Outcome: Completed/Met Date Met:  03/05/14 Goal: Pain controlled with appropriate interventions Outcome: Completed/Met Date Met:  03/05/14 Goal: Dangle or out of bed evening of surgery Outcome: Completed/Met Date Met:  03/05/14 Goal: Initial discharge plan identified Outcome: Completed/Met Date Met:  03/05/14 Goal: Hemodynamically stable Outcome: Completed/Met Date Met:  03/05/14 Goal: Other Phase I Outcomes/Goals Outcome: Completed/Met Date Met:  03/05/14  Problem: Phase II Progression Outcomes Goal: Ambulates Outcome: Completed/Met Date Met:  03/05/14 Goal: Tolerating diet Outcome: Completed/Met Date Met:  03/05/14

## 2014-03-05 NOTE — H&P (View-Only) (Signed)
TOTAL KNEE ADMISSION H&P  Patient is being admitted for left total knee arthroplasty.  Subjective:  Chief Complaint:left knee pain.  HPI: Christina Cook, 63 y.o. female, has a history of pain and functional disability in the left knee due to arthritis and has failed non-surgical conservative treatments for greater than 12 weeks to includeNSAID's and/or analgesics, corticosteriod injections, viscosupplementation injections and activity modification.  Onset of symptoms was abrupt, starting 2 years ago with rapidlly worsening course since that time. The patient noted prior procedures on the knee to include  arthroscopy and menisectomy on the left knee(s).  Patient currently rates pain in the left knee(s) at 5 out of 10 with activity. Patient has night pain, worsening of pain with activity and weight bearing, pain that interferes with activities of daily living, pain with passive range of motion, crepitus and joint swelling.  Patient has evidence of subchondral cysts, subchondral sclerosis, periarticular osteophytes and joint space narrowing by imaging studies. There is no active infection.  Patient Active Problem List   Diagnosis Date Noted  . Osteoarthritis of left knee 12/04/2012  . Meniscus, lateral, posterior horn derangement 12/04/2012  . Medial meniscus, posterior horn derangement 12/04/2012   Past Medical History  Diagnosis Date  . Acute medial meniscal injury of left knee   . Hypertension   . Type 2 diabetes mellitus   . Hyperlipidemia   . Anxiety   . History of renal carcinoma     2007--  S/P LEFT NEPHRECTOMY--  NO CHEMORADIATION--  NO RECURRENCE  . Arthritis     Past Surgical History  Procedure Laterality Date  . Laparoscopic left radical nephrectomy  08-16-2005  . Right shoulder open rotator cuff repair  06-05-2007  . Cataract extraction w/ intraocular lens  implant, bilateral    . Knee arthroscopy with medial menisectomy Left 12/04/2012    Procedure: LEFT KNEE ARTHROSCOPY  WITH Partial MEDIAL MENISECTOMY, and Partial Lateral menisectomy, Abrasion chrondroplasty of left medial femoral condyle, Abrasion chrondroplasty of left lateral femoral condyle;  Surgeon: Tobi Bastos, MD;  Location: [facility];  Service: Orthopedics;  Laterality: Left;     (Not in a hospital admission) Allergies  Allergen Reactions  . Dramamine [Dimenhydrinate] Nausea And Vomiting    History  Substance Use Topics  . Smoking status: Never Smoker   . Smokeless tobacco: Never Used  . Alcohol Use: No    No family history on file.   Review of Systems  Constitutional: Negative.   HENT: Negative.   Eyes: Negative.   Respiratory: Negative.   Cardiovascular: Negative.   Gastrointestinal: Negative.   Genitourinary: Negative.   Musculoskeletal: Positive for joint pain.  Skin: Negative.   Neurological: Negative.   Endo/Heme/Allergies: Negative.   Psychiatric/Behavioral: Negative.     Objective:  Physical Exam  Constitutional: She is oriented to person, place, and time. She appears well-developed and well-nourished.  HENT:  Head: Normocephalic and atraumatic.  Eyes: EOM are normal. Pupils are equal, round, and reactive to light.  Neck: Normal range of motion. Neck supple.  Cardiovascular: Normal rate, regular rhythm and normal heart sounds.  Exam reveals no gallop and no friction rub.   No murmur heard. Respiratory: Effort normal and breath sounds normal. No respiratory distress. She has no wheezes. She has no rales.  GI: Soft. Bowel sounds are normal.  Musculoskeletal:  Markedly antalgic gait with varus, left knee.  Some soreness on the right, but not nearly as much as on the left.  The left knee reveals  motion 0 to about 100 degrees.  Marked tibiofemoral and patellofemoral crepitus.  A little bit of varus which is correctable.  Neurological: She is alert and oriented to person, place, and time.  Skin: Skin is warm and dry.  Psychiatric: She has a normal mood  and affect. Her behavior is normal. Judgment and thought content normal.    Vital signs in last 24 hours: @VSRANGES @  Labs:   Estimated body mass index is 43.60 kg/(m^2) as calculated from the following:   Height as of 12/04/12: 5\' 6"  (1.676 m).   Weight as of 12/04/12: 122.471 kg (270 lb).   Imaging Review Plain radiographs demonstrate severe degenerative joint disease of the left knee(s). The overall alignment ismild varus. The bone quality appears to be fair for age and reported activity level.  Assessment/Plan:  End stage arthritis, left knee   The patient history, physical examination, clinical judgment of the provider and imaging studies are consistent with end stage degenerative joint disease of the left knee(s) and total knee arthroplasty is deemed medically necessary. The treatment options including medical management, injection therapy arthroscopy and arthroplasty were discussed at length. The risks and benefits of total knee arthroplasty were presented and reviewed. The risks due to aseptic loosening, infection, stiffness, patella tracking problems, thromboembolic complications and other imponderables were discussed. The patient acknowledged the explanation, agreed to proceed with the plan and consent was signed. Patient is being admitted for inpatient treatment for surgery, pain control, PT, OT, prophylactic antibiotics, VTE prophylaxis, progressive ambulation and ADL's and discharge planning. The patient is planning to be discharged home with home health services

## 2014-03-05 NOTE — Interval H&P Note (Signed)
History and Physical Interval Note:  03/05/2014 8:22 AM  Christina Cook  has presented today for surgery, with the diagnosis of djd left knee  The various methods of treatment have been discussed with the patient and family. After consideration of risks, benefits and other options for treatment, the patient has consented to  Procedure(s): LEFT TOTAL KNEE ARTHROPLASTY (Left) as a surgical intervention .  The patient's history has been reviewed, patient examined, no change in status, stable for surgery.  I have reviewed the patient's chart and labs.  Questions were answered to the patient's satisfaction.     Jammie Clink F

## 2014-03-05 NOTE — Progress Notes (Signed)
Utilization review completed.  

## 2014-03-05 NOTE — Discharge Summary (Addendum)
Patient ID: Christina Cook MRN: [id number] DOB/AGE: 07-04-1950 63 y.o.  Admit date: 03/05/2014 Discharge date: 03/07/2014  Admission Diagnoses:  Active Problems:   DJD (degenerative joint disease) of knee   Discharge Diagnoses:  Same  Past Medical History  Diagnosis Date  . Acute medial meniscal injury of left knee   . Hypertension   . Type 2 diabetes mellitus   . Hyperlipidemia   . Anxiety   . History of renal carcinoma     2007--  S/P LEFT NEPHRECTOMY--  NO CHEMORADIATION--  NO RECURRENCE  . Arthritis   . Lazy eye of both sides     Surgeries: Procedure(s): LEFT TOTAL KNEE ARTHROPLASTY on 03/05/2014   Consultants:    Discharged Condition: Improved  Hospital Course: Christina Cook is an 63 y.o. female who was admitted 03/05/2014 for operative treatment of primary osteoarthritis of the left knee. Patient has severe unremitting pain that affects sleep, daily activities, and work/hobbies. After pre-op clearance the patient was taken to the operating room on 03/05/2014 and underwent  Procedure(s): LEFT TOTAL KNEE ARTHROPLASTY.  Patient with pre-op hb of 12.4 developed ABLA on pod#1 with a hb of 9.8.  She is stable and asymptomatic.  Will continue to monitor.  Patient was given perioperative antibiotics:      Anti-infectives    Start     Dose/Rate Route Frequency Ordered Stop   03/05/14 1600  ceFAZolin (ANCEF) IVPB 2 g/50 mL premix     2 g100 mL/hr over 30 Minutes Intravenous Every 6 hours 03/05/14 1402 03/05/14 2133   03/05/14 1500  sulfamethoxazole-trimethoprim (BACTRIM,SEPTRA) 400-80 MG per tablet 1 tablet     1 tablet Oral 2 times daily 03/05/14 1402     03/05/14 0600  ceFAZolin (ANCEF) 3 g in dextrose 5 % 50 mL IVPB     3 g160 mL/hr over 30 Minutes Intravenous On call to O.R. 03/04/14 1344 03/05/14 0940       Patient was given sequential compression devices, early ambulation, and chemoprophylaxis to prevent DVT.  Patient benefited maximally from hospital stay and  there were no complications.    Recent vital signs:  Patient Vitals for the past 24 hrs:  BP Temp Pulse Resp SpO2  03/07/14 0528 136/66 mmHg 99.9 F (37.7 C) 85 16 94 %  03/07/14 0400 - - - 17 -  03/07/14 0000 - - - 17 -  03/06/14 2207 112/63 mmHg 100 F (37.8 C) 96 18 90 %  03/06/14 2000 - - - 17 -  03/06/14 1400 (!) 106/59 mmHg 99 F (37.2 C) 90 18 92 %  03/06/14 1100 (!) 102/46 mmHg - - - -     Recent laboratory studies:   Recent Labs  03/05/14 1544 03/06/14 0500 03/07/14 0603  WBC 17.0* 13.1* 9.9  HGB 11.2* 9.8* 9.4*  HCT 34.2* 30.3* 27.8*  PLT 281 297 223  NA  --  138  --   K  --  4.8  --   CL  --  103  --   CO2  --  22  --   BUN  --  18  --   CREATININE 1.14* 1.23*  --   GLUCOSE  --  130*  --   CALCIUM  --  8.5  --      Discharge Medications:     Medication List    STOP taking these medications        aspirin EC 81 MG tablet      TAKE  these medications        bisacodyl 5 MG EC tablet  Commonly known as:  DULCOLAX  Take 1 tablet (5 mg total) by mouth daily as needed for moderate constipation.     CALCIUM 600 + D PO  Take 13 tablets by mouth daily.     enoxaparin 30 MG/0.3ML injection  Commonly known as:  LOVENOX  Inject 0.3 mLs (30 mg total) into the skin every 12 (twelve) hours.     escitalopram 10 MG tablet  Commonly known as:  LEXAPRO  Take 10 mg by mouth daily.     felodipine 10 MG 24 hr tablet  Commonly known as:  PLENDIL  Take 10 mg by mouth every morning.     losartan-hydrochlorothiazide 100-25 MG per tablet  Commonly known as:  HYZAAR  Take 1 tablet by mouth every morning.     metFORMIN 1000 MG tablet  Commonly known as:  GLUCOPHAGE  Take 1,000 mg by mouth 2 (two) times daily with a meal.     methocarbamol 500 MG tablet  Commonly known as:  ROBAXIN  Take 1 tablet (500 mg total) by mouth 4 (four) times daily.     multivitamin tablet  Take 1 tablet by mouth daily.     ondansetron 4 MG tablet  Commonly known as:  ZOFRAN   Take 1 tablet (4 mg total) by mouth every 8 (eight) hours as needed for nausea or vomiting.     oxyCODONE-acetaminophen 5-325 MG per tablet  Commonly known as:  ROXICET  Take 1-2 tablets by mouth every 4 (four) hours as needed.     pyridoxine 100 MG tablet  Commonly known as:  B-6  Take 100 mg by mouth daily.     simvastatin 20 MG tablet  Commonly known as:  ZOCOR  Take 20 mg by mouth every evening.     sulfamethoxazole-trimethoprim 400-80 MG per tablet  Commonly known as:  BACTRIM,SEPTRA  Take 1 tablet by mouth 2 (two) times daily. For 7 days, will complete on 03/07/14. Taking for UTI, prescribed by Dr. Percell Miller.     Vitamin D3 1000 UNITS Caps  Take 1,000 Units by mouth daily.        Diagnostic Studies: Dg Chest 2 View  02/25/2014   CLINICAL DATA:  Patient for left knee total arthroplasty. History of diabetes and hypertension.  EXAM: CHEST  2 VIEW  COMPARISON:  05/24/2010.  FINDINGS: Cardiac silhouette is normal in size. Aorta is tortuous. No mediastinal or hilar masses or evidence of adenopathy.  Clear lungs.  No pleural effusion or pneumothorax.  Bony thorax is demineralized but intact.  IMPRESSION: No active cardiopulmonary disease.   Electronically Signed   By: Lajean Manes M.D.   On: 02/25/2014 10:59   Dg Knee Left Port  03/05/2014   CLINICAL DATA:  Status post left knee replacement  EXAM: PORTABLE LEFT KNEE - 1-2 VIEW  COMPARISON:  None.  FINDINGS: A left knee prosthesis is seen. Surgical drain is identified. Some air is noted in the surgical bed. No acute abnormality is noted.  IMPRESSION: Status post left knee replacement   Electronically Signed   By: Inez Catalina M.D.   On: 03/05/2014 12:45    Disposition: 01-Home or Self Care  Discharge Instructions    CPM    Complete by:  As directed   Continuous passive motion machine (CPM):      Use the CPM from 0- to 60 for 6 hours per day.  You may increase by 10 per day.  You may break it up into 2 or 3 sessions per day.       Use CPM for 2-3 weeks or until you are told to stop.     Call MD / Call 911    Complete by:  As directed   If you experience chest pain or shortness of breath, CALL 911 and be transported to the hospital emergency room.  If you develope a fever above 101 F, pus (white drainage) or increased drainage or redness at the wound, or calf pain, call your surgeon's office.     Change dressing    Complete by:  As directed   Change dressing on Saturday, then change the dressing daily with sterile 4 x 4 inch gauze dressing and apply TED hose.  You may clean the incision with alcohol prior to redressing.     Constipation Prevention    Complete by:  As directed   Drink plenty of fluids.  Prune juice may be helpful.  You may use a stool softener, such as Colace (over the counter) 100 mg twice a day.  Use MiraLax (over the counter) for constipation as needed.     Diet - low sodium heart healthy    Complete by:  As directed      Discharge instructions    Complete by:  As directed   Weight bearing as tolerated.  Use Lovenox injections as directed for a total of 7 days to prevent blood clots.  Change bandage daily starting on Sunday.  May shower on Tuesday, but do not soak incision.  May apply ice for up to 20 minutes at a time for pain and swelling.  Follow up appointment in two weeks.     Do not put a pillow under the knee. Place it under the heel.    Complete by:  As directed   Place gray foam under operative heel when in bed or in a chair to work on extension     Increase activity slowly as tolerated    Complete by:  As directed      TED hose    Complete by:  As directed   Use stockings (TED hose) for 2 weeks on both leg(s).  You may remove them at night for sleeping.           Follow-up Information    Follow up with [facility] F, MD. Schedule an appointment as soon as possible for a visit in 2 weeks.   Specialty:  Orthopedic Surgery   Contact information:   [place] [address] [phone]       Follow up with [facility].   Why:  They will call you to schedule physical therapy visits.   Contact information:   [address] [place] [address] [phone]        Signed: Larae Grooms 03/07/2014, 7:06 AM

## 2014-03-05 NOTE — Anesthesia Preprocedure Evaluation (Addendum)
Anesthesia Evaluation  Patient identified by MRN, date of birth, ID band Patient awake    Reviewed: Allergy & Precautions, H&P , NPO status , Patient's Chart, lab work & pertinent test results  Airway Mallampati: II  TM Distance: >3 FB Neck ROM: Full    Dental  (+) Teeth Intact, Dental Advisory Given   Pulmonary          Cardiovascular hypertension, Pt. on medications     Neuro/Psych Anxiety    GI/Hepatic   Endo/Other  diabetes, Type 2, Oral Hypoglycemic Agents  Renal/GU Renal disease     Musculoskeletal  (+) Arthritis -,   Abdominal   Peds  Hematology   Anesthesia Other Findings   Reproductive/Obstetrics                            Anesthesia Physical Anesthesia Plan  ASA: III  Anesthesia Plan: General   Post-op Pain Management:    Induction: Intravenous  Airway Management Planned: Oral ETT  Additional Equipment:   Intra-op Plan:   Post-operative Plan: Extubation in OR  Informed Consent: I have reviewed the patients History and Physical, chart, labs and discussed the procedure including the risks, benefits and alternatives for the proposed anesthesia with the patient or authorized representative who has indicated his/her understanding and acceptance.   Dental advisory given  Plan Discussed with: CRNA, Anesthesiologist and Surgeon  Anesthesia Plan Comments:        Anesthesia Quick Evaluation

## 2014-03-05 NOTE — Evaluation (Signed)
Physical Therapy Evaluation Patient Details Name: Christina Cook MRN: [id number] DOB: [birth year] Today's Date: 03/05/2014   History of Present Illness  s/p LTKA  Clinical Impression  Pt is s/p TKA resulting in the deficits listed below (see PT Problem List).  Pt will benefit from skilled PT to increase their independence and safety with mobility to allow discharge to the venue listed below.      Follow Up Recommendations Home health PT    Equipment Recommendations  Rolling walker with 5" wheels;3in1 (PT)    Recommendations for Other Services OT consult     Precautions / Restrictions Precautions Precautions: Knee Precaution Comments: Pt educated to not allow any pillow or bolster under knee for healing with optimal range of motion.  Required Braces or Orthoses: Knee Immobilizer - Left (KI in room; did not see a specific order for it) Restrictions Weight Bearing Restrictions: Yes LLE Weight Bearing: Weight bearing as tolerated      Mobility  Bed Mobility Overal bed mobility: Needs Assistance Bed Mobility: Supine to Sit     Supine to sit: Min assist     General bed mobility comments: Min handheld assist to pull to sit and supportive assist for LLE as it came off the side of the bed  Transfers Overall transfer level: Needs assistance Equipment used: Rolling walker (2 wheeled) Transfers: Sit to/from Stand Sit to Stand: Min assist         General transfer comment: Cues for hand placement and safety; minsteadying assist  Ambulation/Gait Ambulation/Gait assistance: +2 safety/equipment;Min assist Ambulation Distance (Feet):  (pivotal steps bed to chair) Assistive device: Rolling walker (2 wheeled)       General Gait Details: Limited amb distance due to some initial dizziness at standing; Noted incr pain with WBing LLE  Stairs            Wheelchair Mobility    Modified Rankin (Stroke Patients Only)       Balance Overall balance assessment: Needs  assistance         Standing balance support: Bilateral upper extremity supported Standing balance-Leahy Scale: Poor                               Pertinent Vitals/Pain Pain Assessment: 0-10 Pain Score: 6  Pain Location: L knee with taking steps; settled to a 2 once in recliner Pain Descriptors / Indicators: Aching Pain Intervention(s): Limited activity within patient's tolerance;Monitored during session    Home Living Family/patient expects to be discharged to:: Private residence Living Arrangements: Spouse/significant other Available Help at Discharge: Family;Available 24 hours/day Type of Home: House Home Access: Stairs to enter Entrance Stairs-Rails: None Entrance Stairs-Number of Steps: 3 (1+2) Home Layout: One level Home Equipment: None      Prior Function Level of Independence: Independent               Hand Dominance        Extremity/Trunk Assessment   Upper Extremity Assessment: Overall WFL for tasks assessed           Lower Extremity Assessment: LLE deficits/detail   LLE Deficits / Details: Decr AROM and strength, limited by pain postop; good quad activation   Cervical / Trunk Assessment: Normal  Communication   Communication: No difficulties  Cognition Arousal/Alertness: Awake/alert Behavior During Therapy: WFL for tasks assessed/performed Overall Cognitive Status: Within Functional Limits for tasks assessed  General Comments      Exercises        Assessment/Plan    PT Assessment Patient needs continued PT services  PT Diagnosis Difficulty walking;Acute pain   PT Problem List Decreased strength;Decreased range of motion;Decreased activity tolerance;Decreased balance;Decreased mobility;Decreased knowledge of use of DME;Pain  PT Treatment Interventions DME instruction;Gait training;Stair training;Functional mobility training;Therapeutic activities;Therapeutic exercise;Patient/family education    PT Goals (Current goals can be found in the Care Plan section) Acute Rehab PT Goals Patient Stated Goal: to walk without pain PT Goal Formulation: With patient Time For Goal Achievement: 03/12/14 Potential to Achieve Goals: Good    Frequency 7X/week   Barriers to discharge        Co-evaluation               End of Session Equipment Utilized During Treatment: Gait belt;Left knee immobilizer Activity Tolerance: Patient tolerated treatment well Patient left: in chair;with call bell/phone within reach;with family/visitor present Nurse Communication: Mobility status         Time: 2836-6294 PT Time Calculation (min): 24 min   Charges:   PT Evaluation $Initial PT Evaluation Tier I: 1 Procedure PT Treatments $Therapeutic Activity: 8-22 mins   PT G Codes:          Quin Hoop 03/05/2014, 4:06 PM  Roney Marion, PT  Acute Rehabilitation Services Pager [phone] Office [phone]

## 2014-03-05 NOTE — Discharge Instructions (Signed)
Total Knee Replacement, Care After Refer to this sheet in the next few weeks. These instructions provide you with information on caring for yourself after your procedure. Your health care provider also may give you specific instructions. Your treatment has been planned according to the most current medical practices, but problems sometimes occur. Call your health care provider if you have any problems or questions after your procedure. HOME CARE INSTRUCTIONS   Weight bearing as tolerated.  Inject Lovenox as directed for a total of 7 days after surgery to prevent blood clots.  Change dressing daily starting on Saturday.  May shower on Monday, but do not soak incision.  May apply ice for up to 20 minutes at a time for pain and swelling.  Follow up appointment in two weeks.    See a physical therapist as directed by your health care provider.  Take medicines only as directed by your health care provider.  Avoid lifting or driving until you are instructed otherwise.  If you have been sent home with a continuous passive motion machine, use it as directed by your health care provider. SEEK MEDICAL CARE IF:  You have difficulty breathing.  You have drainage, redness, swelling, or pain at your incision site.  You have a bad smell coming from your incision site.  You have persistent bleeding from your incision site.  Your incision breaks open after sutures (stitches) or staples have been removed.  You have a fever. SEEK IMMEDIATE MEDICAL CARE IF:   You have a rash.  You have pain or swelling in your calf or thigh.  You have shortness of breath or chest pain.  Your range of motion in your knee is decreasing rather than increasing. MAKE SURE YOU:   Understand these instructions.  Will watch your condition.  Will get help right away if you are not doing well or get worse. Document Released: 11/05/2004 Document Revised: 09/02/2013 Document Reviewed: 06/07/2011 [facility] Patient  Information 2015 [place], [place]. This information is not intended to replace advice given to you by your health care provider. Make sure you discuss any questions you have with your health care provider.

## 2014-03-05 NOTE — Transfer of Care (Signed)
Immediate Anesthesia Transfer of Care Note  Patient: Christina Cook  Procedure(s) Performed: Procedure(s): LEFT TOTAL KNEE ARTHROPLASTY (Left)  Patient Location: PACU  Anesthesia Type:General  Level of Consciousness: awake  Airway & Oxygen Therapy: Patient Spontanous Breathing and Patient connected to nasal cannula oxygen  Post-op Assessment: Report given to PACU RN, Post -op Vital signs reviewed and stable and Patient moving all extremities  Post vital signs: Reviewed and stable  Complications: No apparent anesthesia complications

## 2014-03-05 NOTE — Anesthesia Postprocedure Evaluation (Signed)
  Anesthesia Post-op Note  Patient: Christina Cook  Procedure(s) Performed: Procedure(s): LEFT TOTAL KNEE ARTHROPLASTY (Left)  Patient Location: PACU  Anesthesia Type:General  Level of Consciousness: awake, alert , oriented and patient cooperative  Airway and Oxygen Therapy: Patient Spontanous Breathing  Post-op Pain: moderate  Post-op Assessment: Post-op Vital signs reviewed, Patient's Cardiovascular Status Stable, Respiratory Function Stable, Patent Airway, No signs of Nausea or vomiting and Pain level controlled  Post-op Vital Signs: stable  Last Vitals:  Filed Vitals:   03/05/14 1315  BP: 122/63  Pulse: 74  Temp:   Resp:     Complications: No apparent anesthesia complications

## 2014-03-06 ENCOUNTER — Encounter (HOSPITAL_COMMUNITY): Payer: Self-pay | Admitting: Orthopedic Surgery

## 2014-03-06 LAB — GLUCOSE, CAPILLARY
GLUCOSE-CAPILLARY: 123 mg/dL — AB (ref 70–99)
GLUCOSE-CAPILLARY: 151 mg/dL — AB (ref 70–99)
Glucose-Capillary: 113 mg/dL — ABNORMAL HIGH (ref 70–99)
Glucose-Capillary: 138 mg/dL — ABNORMAL HIGH (ref 70–99)

## 2014-03-06 LAB — BASIC METABOLIC PANEL
ANION GAP: 13 (ref 5–15)
BUN: 18 mg/dL (ref 6–23)
CHLORIDE: 103 meq/L (ref 96–112)
CO2: 22 mEq/L (ref 19–32)
Calcium: 8.5 mg/dL (ref 8.4–10.5)
Creatinine, Ser: 1.23 mg/dL — ABNORMAL HIGH (ref 0.50–1.10)
GFR, EST AFRICAN AMERICAN: 54 mL/min — AB (ref >90)
GFR, EST NON AFRICAN AMERICAN: 46 mL/min — AB (ref >90)
Glucose, Bld: 130 mg/dL — ABNORMAL HIGH (ref 70–99)
POTASSIUM: 4.8 meq/L (ref 3.7–5.3)
SODIUM: 138 meq/L (ref 137–147)

## 2014-03-06 LAB — CBC
HCT: 30.3 % — ABNORMAL LOW (ref 36.0–46.0)
Hemoglobin: 9.8 g/dL — ABNORMAL LOW (ref 12.0–15.0)
MCH: 27.3 pg (ref 26.0–34.0)
MCHC: 32.3 g/dL (ref 30.0–36.0)
MCV: 84.4 fL (ref 78.0–100.0)
PLATELETS: 297 10*3/uL (ref 150–400)
RBC: 3.59 MIL/uL — ABNORMAL LOW (ref 3.87–5.11)
RDW: 13.4 % (ref 11.5–15.5)
WBC: 13.1 10*3/uL — ABNORMAL HIGH (ref 4.0–10.5)

## 2014-03-06 MED ORDER — OXYCODONE HCL ER 15 MG PO T12A
15.0000 mg | EXTENDED_RELEASE_TABLET | Freq: Two times a day (BID) | ORAL | Status: DC
  Administered 2014-03-07: 15 mg via ORAL
  Filled 2014-03-06: qty 1

## 2014-03-06 NOTE — Care Management Note (Signed)
CARE MANAGEMENT NOTE 03/06/2014  Patient:  Christina, Cook   Account Number:  [id number]  Date Initiated:  03/06/2014  Documentation initiated by:  Ricki Miller  Subjective/Objective Assessment:   63 yr old female admitted with left knee DJD. Patient had a left total knee arthroplasty.     Action/Plan:   Patient was preoperatively setup with [facility]. No changes.   Anticipated DC Date:  03/06/2014   Anticipated DC Plan:  [place]  CM consult      [facility] Choice  HOME HEALTH  DURABLE MEDICAL EQUIPMENT   Choice offered to / List presented to:  C-1 Patient   DME arranged  CPM      DME agency  [employer]     [employer] arranged  HH-2 PT      [place]   Status of service:  Completed, signed off Medicare Important Message given?   (If response is "NO", the following Medicare IM given date [place]assert will be blank) Date Medicare IM given:   Medicare IM given by:   Date Additional Medicare IM given:   Additional Medicare IM given by:    Discharge Disposition:  [place]  Per UR Regulation:  Reviewed for med. necessity/level of care/duration of stay  If discussed at [place] of Stay Meetings, dates discussed:    Comments:

## 2014-03-06 NOTE — Evaluation (Signed)
Occupational Therapy Evaluation Patient Details Name: Christina Cook MRN: [id number] DOB: 1950-11-23 Today's Date: 03/06/2014    History of Present Illness s/p L TKA. PMHx:anxiety, DM, HTN   Clinical Impression   This 63 yo female presents to acute OT with increased pain, decreased balance, decreased mobility all affecting her ability to care for herself at an Independent level as she could pta. She will benefit from one more session of acute OT to go over shower stall transfers with her and her husband.    Follow Up Recommendations  No OT follow up    Equipment Recommendations  3 in 1 bedside comode       Precautions / Restrictions Precautions Precautions: Knee Required Braces or Orthoses: Knee Immobilizer - Left Restrictions Weight Bearing Restrictions: No LLE Weight Bearing: Weight bearing as tolerated              ADL Overall ADL's : Needs assistance/impaired Eating/Feeding: Independent;Sitting   Grooming: Set up;Sitting   Upper Body Bathing: Set up;Sitting   Lower Body Bathing: Maximal assistance (with Min A sit<>stand based on PT session this AM)   Upper Body Dressing : Set up;Sitting   Lower Body Dressing: Total assistance (with Min A sit<>stand based on PT session this AM; husband reports that he helped pt get dressed this AM)                 General ADL Comments: I went over with pt and husband the most efficient way to get dressed               Pertinent Vitals/Pain Pain Assessment: 0-10 Pain Score: 8  Pain Location: left knee Pain Descriptors / Indicators: Aching;Shooting (in CPM) Pain Intervention(s): Patient requesting pain meds-RN notified     Hand Dominance Right   Extremity/Trunk Assessment Upper Extremity Assessment Upper Extremity Assessment: Overall WFL for tasks assessed           Communication Communication Communication: No difficulties   Cognition Arousal/Alertness: Lethargic;Suspect due to medications Behavior  During Therapy: [facility] for tasks assessed/performed Overall Cognitive Status: Within Functional Limits for tasks assessed                                Home Living Family/patient expects to be discharged to:: Private residence Living Arrangements: Spouse/significant other Available Help at Discharge: Family;Available 24 hours/day Type of Home: House Home Access: Stairs to enter [employer] of Steps: 3 (1+2) Entrance Stairs-Rails: None Home Layout: One level     Bathroom Shower/Tub: Walk-in shower;Door   [employer] Toilet: Standard     Home Equipment: Shower seat;Hand held shower head          Prior Functioning/Environment Level of Independence: Independent             OT Diagnosis: Generalized weakness;Acute pain   OT Problem List: Decreased strength;Decreased range of motion;Impaired balance (sitting and/or standing);Decreased activity tolerance;Pain;Decreased knowledge of use of DME or AE;Obesity   OT Treatment/Interventions: Self-care/ADL training;Patient/family education;Balance training;DME and/or AE instruction    OT Goals(Current goals can be found in the care plan section) Acute Rehab OT Goals Patient Stated Goal: to get pain medicine OT Goal Formulation: With patient/family Time For Goal Achievement: 03/13/14 Potential to Achieve Goals: Good  OT Frequency: Min 2X/week              End of Session Nurse Communication: Patient requests pain meds  Activity Tolerance: Patient limited by pain  Patient left: in bed;with family/visitor present   Time: 1941-7408 OT Time Calculation (min): 10 min Charges:  OT General Charges $OT Visit: 1 Procedure OT Evaluation $Initial OT Evaluation Tier I: 1 Procedure  Almon Register [phone] 03/06/2014, 3:51 PM

## 2014-03-06 NOTE — Progress Notes (Signed)
Physical Therapy Treatment Patient Details Name: Christina Cook MRN: [id number] DOB: 1950-11-12 Today's Date: 03/06/2014    History of Present Illness s/p L TKA    PT Comments    Pt demonstrating improved activity tolerance during session this morning with PT. Pt's husband quick to A pt with needs. Therapist encouraged independent mobility throughout session. Pt reports she will be more comfortable going home tomorrow than today. Ambulated first time today of significant distance. Will plan to address stairs for home entry in upcoming therapy session prior to d/c. Continue plan to d/c home with husband and follow up HHPT tomorrow.   Follow Up Recommendations  Home health PT     Equipment Recommendations  Rolling walker with 5" wheels;3in1 (PT)    Recommendations for Other Services       Precautions / Restrictions Precautions Required Braces or Orthoses: Knee Immobilizer - Left (no order. Donned for gait but SLR improving) Restrictions LLE Weight Bearing: Weight bearing as tolerated    Mobility  Bed Mobility Overal bed mobility: Needs Assistance Bed Mobility: Supine to Sit;Sit to Supine     Supine to sit: Min assist Sit to supine: Supervision   General bed mobility comments: Pt's husband providing A for supine to sit and therapist encouraging pt to do as much as she can. Has HOB elevated at home also. Able to perform sit to supine with S cues for encouragement  Transfers Overall transfer level: Needs assistance Equipment used: Rolling walker (2 wheeled) Transfers: Sit to/from Stand Sit to Stand: Min assist         General transfer comment: cues for hand placement and technique initially  Ambulation/Gait Ambulation/Gait assistance: Min guard Ambulation Distance (Feet): 60 Feet Assistive device: Rolling walker (2 wheeled) Gait Pattern/deviations: Step-to pattern;Decreased stance time - left;Antalgic Gait velocity: decreased       Stairs             Wheelchair Mobility    Modified Rankin (Stroke Patients Only)       Balance           Standing balance support: Bilateral upper extremity supported Standing balance-Leahy Scale: Poor                      Cognition Arousal/Alertness: Awake/alert Behavior During Therapy: WFL for tasks assessed/performed Overall Cognitive Status: Within Functional Limits for tasks assessed                      Exercises Total Joint Exercises Straight Leg Raises: Strengthening;Left;10 reps;Supine    General Comments        Pertinent Vitals/Pain Pain Score: 8  Pain Location: L knee Pain Descriptors / Indicators: Aching Pain Intervention(s): Monitored during session;Patient requesting pain meds-RN notified;RN gave pain meds during session    Home Living                      Prior Function            PT Goals (current goals can now be found in the care plan section) Acute Rehab PT Goals Patient Stated Goal: to walk without pain PT Goal Formulation: With patient Time For Goal Achievement: 03/12/14 Potential to Achieve Goals: Good Progress towards PT goals: Progressing toward goals    Frequency  7X/week    PT Plan Current plan remains appropriate    Co-evaluation             End of Session Equipment Utilized During  Treatment: Gait belt;Left knee immobilizer Activity Tolerance: Patient tolerated treatment well Patient left: in bed;with call bell/phone within reach;with family/visitor present (husband to A with putting CPM back on in a few min)     Time: 0722-5750 PT Time Calculation (min): 23 min  Charges:  $Gait Training: 8-22 mins $Therapeutic Activity: 8-22 mins                    G Codes:      Allayne Gitelman 03/06/2014, 10:05 AM

## 2014-03-06 NOTE — Progress Notes (Signed)
Subjective: 1 Day Post-Op Procedure(s) (LRB): LEFT TOTAL KNEE ARTHROPLASTY (Left) Patient reports pain as 3 on 0-10 scale.  No nausea/vomiting, lightheadedness/dizziness.  No flatus and no bm as of yet.  Tolerating diet.    Objective: Vital signs in last 24 hours: Temp:  [97.2 F (36.2 C)-99 F (37.2 C)] 99 F (37.2 C) (11/05 0523) Pulse Rate:  [68-100] 78 (11/05 0523) Resp:  [16-20] 16 (11/05 0523) BP: (103-147)/(41-78) 109/41 mmHg (11/05 0523) SpO2:  [92 %-96 %] 92 % (11/05 0523) Weight:  [124.286 kg (274 lb)] 124.286 kg (274 lb) (11/04 0811)  Intake/Output from previous day: 11/04 0701 - 11/05 0700 In: 1938.3 [P.O.:240; I.V.:1648.3; IV Piggyback:50] Out: 1595 [Urine:870; Drains:725] Intake/Output this shift: Total I/O In: -  Out: 525 [Urine:350; Drains:175]   Recent Labs  03/05/14 1544 03/06/14 0500  HGB 11.2* 9.8*    Recent Labs  03/05/14 1544 03/06/14 0500  WBC 17.0* 13.1*  RBC 4.05 3.59*  HCT 34.2* 30.3*  PLT 281 297    Recent Labs  03/05/14 1544 03/06/14 0500  NA  --  138  K  --  4.8  CL  --  103  CO2  --  22  BUN  --  18  CREATININE 1.14* 1.23*  GLUCOSE  --  130*  CALCIUM  --  8.5   No results for input(s): LABPT, INR in the last 72 hours.  Neurologically intact Neurovascular intact Sensation intact distally Intact pulses distally Dorsiflexion/Plantar flexion intact Compartment soft  Negative homans bilaterally hemovac drain pulled by me today  Assessment/Plan: 1 Day Post-Op Procedure(s) (LRB): LEFT TOTAL KNEE ARTHROPLASTY (Left) Advance diet Up with therapy Discharge home with home health today or tomorrow depending on how patient feels following PT WBAT LLE ABLA-mild and stable Will continue fluid for an additional day secondary to decreased kidney function  ANTON, M. LINDSEY 03/06/2014, 6:55 AM

## 2014-03-06 NOTE — Op Note (Signed)
Christina Cook, Christina Cook              ACCOUNT NO.:  [id number]  MEDICAL RECORD NO.:  [id number]  LOCATION:  5N14C                        FACILITY:  [facility]  PHYSICIAN:  Ninetta Lights, M.D. DATE OF BIRTH:  [birth year]  DATE OF PROCEDURE:  03/05/2014 DATE OF DISCHARGE:                              OPERATIVE REPORT   PREOPERATIVE DIAGNOSIS:  Generalized primary end-stage degenerative arthritis, affecting both knees, left greater than right.  POSTOPERATIVE DIAGNOSIS:  Generalized primary end-stage degenerative arthritis, affecting both knees, left greater than right.  PROCEDURE:  Left knee modified minimally invasive total knee replacement with Stryker Triathlon prosthesis.  Soft tissue balancing.  A cemented pegged #4 cruciate retaining femoral component.  Cemented #5 tibial component, 9 mm CS insert.  Cemented resurfacing 35 mm patellar component.  Removal of mark spurs around the patella including one on the anterior surface.  SURGEON:  Ninetta Lights, M.D.  ASSISTANT:  Doran Stabler, PA, present throughout the entire case and necessary for timely completion of procedure.  ANESTHESIA:  General.  BLOOD LOSS:  Minimal.  SPECIMENS:  None.  CULTURES:  None.  COMPLICATIONS:  None.  DRESSINGS:  Soft compressive knee immobilizer.  DRAINS:  Hemovac x1.  TOURNIQUET TIME:  One hour.  PROCEDURE IN DETAIL:  The patient was brought to the operating room, placed on the operating table in supine position.  After adequate anesthesia had been obtained, tourniquet applied, prepped and draped in usual sterile fashion.  Exsanguinated with elevation of Esmarch. Tourniquet inflated to 350 mmHg.  A longitudinal incision above the patella was made up to tibial tubercle.  Skin and subcutaneous tissues were divided, medial arthrotomy, vastus splitting, preserving quad tendon.  Very prominent spur on the front of the patella that was sticking outward was shaved off and contoured smoothly  protecting the extensor mechanism.  Knee exposed.  Flexible intramedullary guide, distal femur.  An 8 mm resection, 5 degrees of valgus.  Using epicondylar axis, the femur was sized, cut, and fitted for a pegged #4 component.  Proximal tibial resection with extramedullary guide, size #5 component.  Patella exposed.  Spurs removed.  Posterior 10 mm removed. Drilled, sized, and fitted for a 35 mm component.  Copious irrigation with pulse lavage.  Trials put in place throughout.  Tibia was marked for rotation and hand reamed.  All trials removed.  Irrigated once again.  Cement prepared, placed on all components, firmly seated, with a 9 mm insert on the tibia and a 35 patella.  Once the cement hardened, I was very pleased with full motion, good biomechanical axis.  Nicely balanced in flexion-extension.  Good stability.  Soft tissues injected with Exparel.  Hemovac placed.  Arthrotomy closed with #1 Vicryl, skin and subcutaneous tissue with Vicryl, subcutaneous subcuticular closure. Sterile compressive dressing applied.  Tourniquet removed.  Knee immobilizer applied.  Anesthesia reversed.  Brought to the recovery room.  Tolerated the surgery well.  No complications.     Ninetta Lights, M.D.     DFM/MEDQ  D:  03/06/2014  T:  03/06/2014  Job:  [id number]

## 2014-03-06 NOTE — Progress Notes (Signed)
Patient with continued sharp pain Left knee site in spite of Oxycodone and Dilaudid. PA notified. Orders done.

## 2014-03-06 NOTE — Progress Notes (Signed)
Physical Therapy Treatment Patient Details Name: Christina Cook MRN: [id number] DOB: 1951/04/23 Today's Date: 03/06/2014    History of Present Illness s/p L TKA    PT Comments    Session limited due to increased pain in L knee (RN already aware and PA notified. Medication already given). Pt agreeable to supine therex only due to pain. See details below. Recommending to attempt stairs with PT prior to d/c if still planned for tomorrow. Encouraged pt to get up to chair this PM and walk with nursing staff as able. Continue with POC.  Follow Up Recommendations  Home health PT     Equipment Recommendations  Rolling walker with 5" wheels;3in1 (PT)    Recommendations for Other Services       Precautions / Restrictions Precautions Required Braces or Orthoses: Knee Immobilizer - Left (no order but used with gait in AM session) Restrictions Weight Bearing Restrictions: Yes LLE Weight Bearing: Weight bearing as tolerated    Mobility  Bed Mobility Overal bed mobility: Needs Assistance Bed Mobility: Supine to Sit;Sit to Supine     Supine to sit: Min assist Sit to supine: Supervision   General bed mobility comments: Pt's husband providing A for supine to sit and therapist encouraging pt to do as much as she can. Has HOB elevated at home also. Able to perform sit to supine with S cues for encouragement  Transfers Overall transfer level: Needs assistance Equipment used: Rolling walker (2 wheeled) Transfers: Sit to/from Stand Sit to Stand: Min assist         General transfer comment: cues for hand placement and technique initially  Ambulation/Gait Ambulation/Gait assistance: Min guard Ambulation Distance (Feet): 60 Feet Assistive device: Rolling walker (2 wheeled) Gait Pattern/deviations: Step-to pattern;Decreased stance time - left;Antalgic Gait velocity: decreased       Stairs            Wheelchair Mobility    Modified Rankin (Stroke Patients Only)        Balance           Standing balance support: Bilateral upper extremity supported Standing balance-Leahy Scale: Poor                      Cognition Arousal/Alertness: Lethargic;Suspect due to medications (Pt reports feeling groggy) Behavior During Therapy: WFL for tasks assessed/performed Overall Cognitive Status: Within Functional Limits for tasks assessed                      Exercises Total Joint Exercises Ankle Circles/Pumps: AROM;Strengthening;Both;10 reps Quad Sets: Strengthening;Left;10 reps (2 sets) Heel Slides: AAROM;Strengthening;Left;10 reps (2 sets) Hip ABduction/ADduction: AAROM;Strengthening;Left;10 reps (x 2 sets) Straight Leg Raises: Strengthening;10 reps (x 2 sets)    General Comments        Pertinent Vitals/Pain Pain Score: 8  Pain Location: L knee Pain Descriptors / Indicators: Shooting Pain Intervention(s): Monitored during session;Limited activity within patient's tolerance;Premedicated before session    Home Living                      Prior Function            PT Goals (current goals can now be found in the care plan section) Acute Rehab PT Goals Patient Stated Goal: to walk without pain PT Goal Formulation: With patient Time For Goal Achievement: 03/12/14 Potential to Achieve Goals: Good Progress towards PT goals: Progressing toward goals    Frequency  7X/week    PT Plan  Current plan remains appropriate    Co-evaluation             End of Session Equipment Utilized During Treatment: Gait belt;Left knee immobilizer Activity Tolerance: Patient limited by pain Patient left: in bed;with call bell/phone within reach;with family/visitor present     Time: 6734-1937 PT Time Calculation (min): 18 min  Charges:  $Gait Training: 8-22 mins $Therapeutic Exercise: 8-22 mins $Therapeutic Activity: 8-22 mins                    G Codes:      Allayne Gitelman 03/06/2014, 1:58 PM

## 2014-03-07 LAB — GLUCOSE, CAPILLARY
GLUCOSE-CAPILLARY: 100 mg/dL — AB (ref 70–99)
Glucose-Capillary: 139 mg/dL — ABNORMAL HIGH (ref 70–99)

## 2014-03-07 LAB — BASIC METABOLIC PANEL
Anion gap: 13 (ref 5–15)
BUN: 16 mg/dL (ref 6–23)
CO2: 22 mEq/L (ref 19–32)
Calcium: 8.6 mg/dL (ref 8.4–10.5)
Chloride: 102 mEq/L (ref 96–112)
Creatinine, Ser: 1.12 mg/dL — ABNORMAL HIGH (ref 0.50–1.10)
GFR, EST AFRICAN AMERICAN: 60 mL/min — AB (ref >90)
GFR, EST NON AFRICAN AMERICAN: 52 mL/min — AB (ref >90)
Glucose, Bld: 140 mg/dL — ABNORMAL HIGH (ref 70–99)
Potassium: 4.5 mEq/L (ref 3.7–5.3)
SODIUM: 137 meq/L (ref 137–147)

## 2014-03-07 LAB — CBC
HEMATOCRIT: 27.8 % — AB (ref 36.0–46.0)
HEMOGLOBIN: 9.4 g/dL — AB (ref 12.0–15.0)
MCH: 28.2 pg (ref 26.0–34.0)
MCHC: 33.8 g/dL (ref 30.0–36.0)
MCV: 83.5 fL (ref 78.0–100.0)
Platelets: 223 10*3/uL (ref 150–400)
RBC: 3.33 MIL/uL — ABNORMAL LOW (ref 3.87–5.11)
RDW: 13.6 % (ref 11.5–15.5)
WBC: 9.9 10*3/uL (ref 4.0–10.5)

## 2014-03-07 NOTE — Progress Notes (Signed)
Occupational Therapy Treatment and Discharge Patient Details Name: Christina Cook MRN: [id number] DOB: 21-Dec-1950 Today's Date: 03/07/2014    History of present illness s/p L TKA. PMHx:anxiety, DM, HTN   OT comments  This 63 yo female admitted for above presents to acute OT with all education completed with pt and husband, we will sign off.  Follow Up Recommendations  No OT follow up    Equipment Recommendations  3 in 1 bedside comode       Precautions / Restrictions Precautions Precautions: Knee Required Braces or Orthoses: Knee Immobilizer - Left (did shower stall transfer fine without KI) Restrictions Weight Bearing Restrictions: No LLE Weight Bearing: Weight bearing as tolerated       Mobility Bed Mobility Overal bed mobility: Needs Assistance Bed Mobility: Supine to Sit;Sit to Supine     Supine to sit: Min assist;HOB elevated Sit to supine: Min assist;HOB elevated      Transfers Overall transfer level: Needs assistance Equipment used: Rolling walker (2 wheeled) Transfers: Sit to/from Stand Sit to Stand: Min guard                  ADL Overall ADL's : Needs assistance/impaired                                 Tub/ Shower Transfer: Walk-in shower;Minimal assistance;Ambulation;3 in 1     General ADL Comments: Went back over most efficient way to get dressed. Pt and husband were able to tell me to stand up only once to pull up underwear and pants--but forgot that if they dress the UB before standing up then once she does stand up to pull up LB clothing she is all dressed and ready to head to wherever she planned on going after she got dressed (ie: living room, kitchen, etc)                Cognition   Behavior During Therapy: WFL for tasks assessed/performed Overall Cognitive Status: Within Functional Limits for tasks assessed                                    Pertinent Vitals/ Pain       Pain Assessment: 0-10 Pain  Score: 3  Pain Location: left knee Pain Descriptors / Indicators: Aching;Tightness         Frequency Min 2X/week     Progress Toward Goals  OT Goals(current goals can now be found in the care plan section)  Progress towards OT goals:  (All education completed)     Plan Discharge plan remains appropriate       End of Session Equipment Utilized During Treatment: Gait belt;Rolling walker   Activity Tolerance Patient tolerated treatment well   Patient Left in bed;with call bell/phone within reach;with family/visitor present           Time: 7124-5809 OT Time Calculation (min): 20 min  Charges: OT General Charges $OT Visit: 1 Procedure OT Treatments $Self Care/Home Management : 8-22 mins  Almon Register [phone] 03/07/2014, 9:05 AM

## 2014-03-07 NOTE — Plan of Care (Signed)
Problem: Phase II Progression Outcomes Goal: Discharge plan established Outcome: Completed/Met Date Met:  03/07/14 Goal: Other Phase II Outcomes/Goals Outcome: Completed/Met Date Met:  03/07/14

## 2014-03-07 NOTE — Progress Notes (Signed)
Physical Therapy Treatment Patient Details Name: Christina Cook MRN: [id number] DOB: 27-Feb-1951 Today's Date: 03/07/2014    History of Present Illness s/p L TKA. PMHx:anxiety, DM, HTN    PT Comments    Pt. Making steady progress with PT.  Husband able to demo safe assistance with pt. In sit<>stand, on steps and in walking.    Follow Up Recommendations  Home health PT;Supervision/Assistance - 24 hour;Supervision for mobility/OOB     Equipment Recommendations  Rolling walker with 5" wheels;3in1 (PT)    Recommendations for Other Services OT consult     Precautions / Restrictions Precautions Precautions: Knee Precaution Comments: reviewed knee precautions and positioning Required Braces or Orthoses: Knee Immobilizer - Left Restrictions Weight Bearing Restrictions: Yes LLE Weight Bearing: Weight bearing as tolerated    Mobility  Bed Mobility Overal bed mobility:  (pt. presented in recliner chair) Bed Mobility: Supine to Sit;Sit to Supine     Supine to sit: Min assist;HOB elevated Sit to supine: Min assist;HOB elevated      Transfers Overall transfer level: Needs assistance Equipment used: Rolling walker (2 wheeled) Transfers: Sit to/from Stand Sit to Stand: Min guard         General transfer comment: cues for hand placement and technique initially  Ambulation/Gait Ambulation/Gait assistance: Min guard;+2 safety/equipment (second peson for chair follow) Ambulation Distance (Feet): 60 Feet Assistive device: Rolling walker (2 wheeled) Gait Pattern/deviations: Step-to pattern;Decreased step length - left;Antalgic Gait velocity: decreased   General Gait Details: limited distance due to early fatigue   Stairs Stairs: Yes Stairs assistance: +2 safety/equipment Stair Management: No rails;Backwards;Step to pattern;With walker Number of Stairs: 3 General stair comments: Pt's husband was taught safe technique and bracing of RW on steps; pt. needed min assist for  stability and second person for Rw management  Wheelchair Mobility    Modified Rankin (Stroke Patients Only)       Balance                                    Cognition Arousal/Alertness: Awake/alert Behavior During Therapy: WFL for tasks assessed/performed Overall Cognitive Status: Within Functional Limits for tasks assessed                      Exercises Total Joint Exercises Ankle Circles/Pumps: AROM;Strengthening;Both;10 reps Quad Sets: AROM;Left;10 reps;Seated Short Arc Quad: AROM;10 reps;Seated Long Arc Quad: AROM;Left;10 reps;Seated Knee Flexion: AROM;5 reps;Seated Goniometric ROM: 0 to 75    General Comments        Pertinent Vitals/Pain Pain Assessment: 0-10 Pain Score: 4  Pain Location: left knee Pain Descriptors / Indicators: Aching Pain Intervention(s): Limited activity within patient's tolerance;Monitored during session;Repositioned    Home Living                      Prior Function            PT Goals (current goals can now be found in the care plan section) Progress towards PT goals: Progressing toward goals    Frequency  7X/week    PT Plan Current plan remains appropriate    Co-evaluation             End of Session Equipment Utilized During Treatment: Gait belt;Left knee immobilizer Activity Tolerance: Patient limited by fatigue Patient left: in chair;with call bell/phone within reach;with family/visitor present (husband)     Time: 4270-6237 PT Time Calculation (min):  39 min  Charges:  $Gait Training: 23-37 mins $Therapeutic Exercise: 8-22 mins                    G Codes:      Ladona Ridgel 03/07/2014, 12:38 PM Gerlean Ren PT Acute Rehab Services [place] [phone]

## 2014-03-07 NOTE — Progress Notes (Signed)
Subjective: 2 Days Post-Op Procedure(s) (LRB): LEFT TOTAL KNEE ARTHROPLASTY (Left) Patient reports pain as 5 on 0-10 scale.  Patient reports minimal lightheadedness when transferring from bed to chair.  She also reports sore/scratchy throat.  Has hx of strep throat multiple times per year.  No nausea/vomiting.  Positive flatus but no bm.  Tolerating diet.    Objective: Vital signs in last 24 hours: Temp:  [99 F (37.2 C)-100 F (37.8 C)] 99.9 F (37.7 C) (11/06 0528) Pulse Rate:  [85-96] 85 (11/06 0528) Resp:  [16-18] 16 (11/06 0528) BP: (102-136)/(46-66) 136/66 mmHg (11/06 0528) SpO2:  [90 %-94 %] 94 % (11/06 0528)  Intake/Output from previous day: 11/05 0701 - 11/06 0700 In: 1080 [P.O.:1080] Out: -  Intake/Output this shift:     Recent Labs  03/05/14 1544 03/06/14 0500  HGB 11.2* 9.8*    Recent Labs  03/05/14 1544 03/06/14 0500  WBC 17.0* 13.1*  RBC 4.05 3.59*  HCT 34.2* 30.3*  PLT 281 297    Recent Labs  03/05/14 1544 03/06/14 0500  NA  --  138  K  --  4.8  CL  --  103  CO2  --  22  BUN  --  18  CREATININE 1.14* 1.23*  GLUCOSE  --  130*  CALCIUM  --  8.5   No results for input(s): LABPT, INR in the last 72 hours.  Neurologically intact Neurovascular intact Sensation intact distally Intact pulses distally Dorsiflexion/Plantar flexion intact Incision: scant drainage No cellulitis present Compartment soft  Dressing changed by me today  Assessment/Plan: 2 Days Post-Op Procedure(s) (LRB): LEFT TOTAL KNEE ARTHROPLASTY (Left) Advance diet Up with therapy D/C IV fluids Discharge home with home health most likely today  WBAT LLE ABLA-mild and stable.  Patient asymptomatic   ANTON, Corliss Skains 03/07/2014, 6:58 AM

## 2014-03-07 NOTE — Progress Notes (Signed)
Patient was discharged home with husband. Patient was given discharge instructions and prescriptions. Patient was educated on lovenox injection instructions. Patient was also educated on incision site care and signs of infection. Patient was educated on home care for incision. Patient was stable upon discharge.

## 2014-11-05 ENCOUNTER — Other Ambulatory Visit: Payer: Self-pay | Admitting: Physician Assistant

## 2014-11-10 ENCOUNTER — Encounter (HOSPITAL_BASED_OUTPATIENT_CLINIC_OR_DEPARTMENT_OTHER): Payer: Self-pay | Admitting: *Deleted

## 2014-11-11 ENCOUNTER — Encounter (HOSPITAL_BASED_OUTPATIENT_CLINIC_OR_DEPARTMENT_OTHER)
Admission: RE | Admit: 2014-11-11 | Discharge: 2014-11-11 | Disposition: A | Discharge status: Home / Self Care | Payer: Self-pay | Source: Ambulatory Visit | Attending: Orthopedic Surgery | Admitting: Orthopedic Surgery

## 2014-11-11 ENCOUNTER — Other Ambulatory Visit: Payer: Self-pay | Admitting: Physician Assistant

## 2014-11-11 DIAGNOSIS — M19011 Primary osteoarthritis, right shoulder: Secondary | ICD-10-CM | POA: Diagnosis not present

## 2014-11-11 DIAGNOSIS — M75121 Complete rotator cuff tear or rupture of right shoulder, not specified as traumatic: Secondary | ICD-10-CM | POA: Diagnosis not present

## 2014-11-11 DIAGNOSIS — M7581 Other shoulder lesions, right shoulder: Secondary | ICD-10-CM | POA: Diagnosis not present

## 2014-11-11 DIAGNOSIS — M7551 Bursitis of right shoulder: Secondary | ICD-10-CM | POA: Diagnosis not present

## 2014-11-11 DIAGNOSIS — M75101 Unspecified rotator cuff tear or rupture of right shoulder, not specified as traumatic: Secondary | ICD-10-CM | POA: Diagnosis present

## 2014-11-11 DIAGNOSIS — M7541 Impingement syndrome of right shoulder: Secondary | ICD-10-CM | POA: Diagnosis not present

## 2014-11-11 LAB — BASIC METABOLIC PANEL
ANION GAP: 11 (ref 5–15)
BUN: 22 mg/dL — AB (ref 6–20)
CALCIUM: 9.5 mg/dL (ref 8.9–10.3)
CO2: 25 mmol/L (ref 22–32)
CREATININE: 1.16 mg/dL — AB (ref 0.44–1.00)
Chloride: 102 mmol/L (ref 101–111)
GFR calc Af Amer: 57 mL/min — ABNORMAL LOW (ref >60)
GFR, EST NON AFRICAN AMERICAN: 49 mL/min — AB (ref >60)
Glucose, Bld: 117 mg/dL — ABNORMAL HIGH (ref 65–99)
Potassium: 3.7 mmol/L (ref 3.5–5.1)
SODIUM: 138 mmol/L (ref 135–145)

## 2014-11-11 NOTE — H&P (Signed)
Lyda comes in for evaluation and treatment recommendation for her right shoulder.  Something I have not looked at in the past.  Relatively abrupt onset of symptoms over the last three weeks.  No specific trauma.  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  This is getting worse rather than better.   Previous history of rotator cuff tear treated with an open decompression and cuff repair back in 2008 at [facility].  Recurrent symptoms 3-4 years ago.  MRI evaluation revealing a possible small recurrent tear.  Treated with injection which helped a lot and it has continued to help until recently.  She is coming in specifically to see if we can re-inject her.  Great historian, I don't have old op notes.   History and general exam is outlined and included in the chart.   EXAMINATION: Specifically, I can get her through just about full motion, right shoulder.  Positive impingement.  Positive palms down abduction.  Definite bursitis.  A little give way weakness.  Biceps intact.  A little sore over the [facility] joint.  No instability.  Opposite left shoulder has good motion and good function.    X-RAYS: Three views of the right shoulder show moderate degenerative changes in the shoulder, not extreme.  Type I acromion which I think is a marked improvement from what she described she had before.  There are still considerable degenerative changes and spurring AC joint.  Reasonable subacromial space.     DISPOSITION:  I am concerned about progression of a tear, but she has done well after her shot four years ago until recently.  We are going to repeat her injection.  I went over a Jobe exercise program.  If this does not allow marked improvement she is to call and I will get an MR arthrogram to assess her cuff.  If she is improved she will let me know.  My plan is to follow up with her in November of this year for routine follow up for her knee replacement.    PROCEDURE NOTE: The  patient's clinical condition is marked by substantial pain and/or significant functional disability.  Other conservative therapy has not provided relief, is contraindicated, or not appropriate.  There is a reasonable likelihood that injection will significantly improve the patient's pain and/or functional disability. After appropriate consent and under sterile technique subacromial injection of the right shoulder with Depo-Medrol/Marcaine.  Tolerated this well.  Good relief with numbing medicine in place.    Ninetta Lights, M.D.     AddendumSophiarose Eades is a 64 year-old female who presents to our clinic to discuss her right shoulder MR arthrogram.  Currently has a history of right shoulder rotator cuff repair back in 2008.  She was doing well up until 3-4 years ago for recurrent symptoms.  MRI evaluation at that point in time revealed a possible small recurrent tear.  It was treated with subacromial injection, which helped until about one month ago.  When we saw her on March 22nd of this past year we repeated the subacromial injection.  Karmel states she had absolutely no relief, even with Marcaine in place.  We then proceeded with an MR arthrogram because of her significant history of her right shoulder.  The results of the MR arthrogram from August 13, 2014 revealed a partial thickness articular surface tear involving the infraspinatus, supraspinatus junction region with approximately 14 mm retraction of the articular fibers.  No full thickness tear identified.  Significant subscapularis tendinopathy and partial thickness tearing.  Moderate tendinopathy biceps tendon and superior labral degenerative changes, but no discreet labral tears.  Because Elexis does admit to having a little bit of relief of pain two days following the MR arthrogram, but this is really starting to diminish.    PLAN: Today we discussed surgical intervention with a right shoulder decompression and probably rotator cuff repair.   Cheyrl has an upcoming family trip planned to [facility] on June 20th and would like to put this off until she returns home.  This is okay with [place].  Today we went ahead and fill out paperwork.  Risks, benefits and possible complications of surgery were reviewed.  Rehab and recovery time discussed.  All questions were answered.  Syriana is to most likely be out of work for about six weeks following surgery.  We told her that if she would like to come in prior to her trip to [facility] she can and we can inject her shoulder intraarticularly where she would most likely get relief for a short period of time.     Ninetta Lights, M.D.

## 2014-11-13 ENCOUNTER — Ambulatory Visit (HOSPITAL_BASED_OUTPATIENT_CLINIC_OR_DEPARTMENT_OTHER): Payer: 59 | Admitting: Anesthesiology

## 2014-11-13 ENCOUNTER — Ambulatory Visit (HOSPITAL_BASED_OUTPATIENT_CLINIC_OR_DEPARTMENT_OTHER)
Admission: RE | Admit: 2014-11-13 | Discharge: 2014-11-13 | Disposition: A | Discharge status: Home / Self Care | Payer: 59 | Source: Ambulatory Visit | Attending: Orthopedic Surgery | Admitting: Orthopedic Surgery

## 2014-11-13 ENCOUNTER — Encounter (HOSPITAL_BASED_OUTPATIENT_CLINIC_OR_DEPARTMENT_OTHER): Payer: Self-pay | Admitting: *Deleted

## 2014-11-13 ENCOUNTER — Encounter (HOSPITAL_BASED_OUTPATIENT_CLINIC_OR_DEPARTMENT_OTHER)
Admission: RE | Disposition: A | Discharge status: Home / Self Care | Payer: Self-pay | Source: Ambulatory Visit | Attending: Orthopedic Surgery

## 2014-11-13 DIAGNOSIS — M75121 Complete rotator cuff tear or rupture of right shoulder, not specified as traumatic: Secondary | ICD-10-CM | POA: Diagnosis not present

## 2014-11-13 DIAGNOSIS — M7581 Other shoulder lesions, right shoulder: Secondary | ICD-10-CM | POA: Insufficient documentation

## 2014-11-13 DIAGNOSIS — M19011 Primary osteoarthritis, right shoulder: Secondary | ICD-10-CM | POA: Insufficient documentation

## 2014-11-13 DIAGNOSIS — M7541 Impingement syndrome of right shoulder: Secondary | ICD-10-CM | POA: Insufficient documentation

## 2014-11-13 DIAGNOSIS — M7551 Bursitis of right shoulder: Secondary | ICD-10-CM | POA: Insufficient documentation

## 2014-11-13 HISTORY — PX: SHOULDER ARTHROSCOPY WITH ROTATOR CUFF REPAIR AND SUBACROMIAL DECOMPRESSION: SHX5686

## 2014-11-13 HISTORY — DX: Depression, unspecified: F32.A

## 2014-11-13 HISTORY — PX: SHOULDER ARTHROSCOPY WITH DISTAL CLAVICLE RESECTION: SHX5675

## 2014-11-13 HISTORY — DX: Major depressive disorder, single episode, unspecified: F32.9

## 2014-11-13 HISTORY — DX: Malignant (primary) neoplasm, unspecified: C80.1

## 2014-11-13 LAB — POCT HEMOGLOBIN-HEMACUE: HEMOGLOBIN: 13.7 g/dL (ref 12.0–15.0)

## 2014-11-13 LAB — GLUCOSE, CAPILLARY
GLUCOSE-CAPILLARY: 109 mg/dL — AB (ref 65–99)
Glucose-Capillary: 138 mg/dL — ABNORMAL HIGH (ref 65–99)

## 2014-11-13 SURGERY — SHOULDER ARTHROSCOPY WITH ROTATOR CUFF REPAIR AND SUBACROMIAL DECOMPRESSION
Anesthesia: General | Laterality: Right

## 2014-11-13 MED ORDER — METOCLOPRAMIDE HCL 5 MG/ML IJ SOLN: 5.0000 mg | Freq: Three times a day (TID) | INTRAMUSCULAR | Status: DC | PRN

## 2014-11-13 MED ORDER — METOCLOPRAMIDE HCL 5 MG PO TABS: 5.0000 mg | ORAL_TABLET | Freq: Three times a day (TID) | ORAL | Status: DC | PRN

## 2014-11-13 MED ORDER — SODIUM CHLORIDE 0.9 % IR SOLN
Status: DC | PRN
  Administered 2014-11-13: 12000 mL

## 2014-11-13 MED ORDER — ONDANSETRON HCL 4 MG/2ML IJ SOLN: 4.0000 mg | Freq: Four times a day (QID) | INTRAMUSCULAR | Status: DC | PRN

## 2014-11-13 MED ORDER — HYDROMORPHONE HCL 1 MG/ML IJ SOLN: 0.2500 mg | INTRAMUSCULAR | Status: DC | PRN

## 2014-11-13 MED ORDER — ONDANSETRON HCL 4 MG/2ML IJ SOLN: 4.0000 mg | Freq: Once | INTRAMUSCULAR | Status: DC | PRN

## 2014-11-13 MED ORDER — CEFAZOLIN SODIUM 1-5 GM-% IV SOLN
INTRAVENOUS | Status: AC
  Filled 2014-11-13: qty 50

## 2014-11-13 MED ORDER — HYDROMORPHONE HCL 1 MG/ML IJ SOLN: 0.5000 mg | INTRAMUSCULAR | Status: DC | PRN

## 2014-11-13 MED ORDER — METHOCARBAMOL 1000 MG/10ML IJ SOLN: 500.0000 mg | Freq: Four times a day (QID) | INTRAMUSCULAR | Status: DC | PRN

## 2014-11-13 MED ORDER — CEFAZOLIN SODIUM-DEXTROSE 2-3 GM-% IV SOLR
2.0000 g | INTRAVENOUS | Status: AC
  Administered 2014-11-13: 3 g via INTRAVENOUS

## 2014-11-13 MED ORDER — FENTANYL CITRATE (PF) 100 MCG/2ML IJ SOLN
INTRAMUSCULAR | Status: AC
  Filled 2014-11-13: qty 2

## 2014-11-13 MED ORDER — DEXAMETHASONE SODIUM PHOSPHATE 4 MG/ML IJ SOLN
INTRAMUSCULAR | Status: DC | PRN
  Administered 2014-11-13: 10 mg via INTRAVENOUS

## 2014-11-13 MED ORDER — CHLORHEXIDINE GLUCONATE 4 % EX LIQD: 60.0000 mL | Freq: Once | CUTANEOUS | Status: DC

## 2014-11-13 MED ORDER — MEPERIDINE HCL 25 MG/ML IJ SOLN: 6.2500 mg | INTRAMUSCULAR | Status: DC | PRN

## 2014-11-13 MED ORDER — OXYCODONE-ACETAMINOPHEN 5-325 MG PO TABS: 1.0000 | ORAL_TABLET | ORAL | Status: DC | PRN

## 2014-11-13 MED ORDER — GLYCOPYRROLATE 0.2 MG/ML IJ SOLN: 0.2000 mg | Freq: Once | INTRAMUSCULAR | Status: DC | PRN

## 2014-11-13 MED ORDER — FENTANYL CITRATE (PF) 100 MCG/2ML IJ SOLN
50.0000 ug | INTRAMUSCULAR | Status: DC | PRN
  Administered 2014-11-13: 50 ug via INTRAVENOUS
  Administered 2014-11-13: 100 ug via INTRAVENOUS

## 2014-11-13 MED ORDER — EPHEDRINE SULFATE 50 MG/ML IJ SOLN
INTRAMUSCULAR | Status: DC | PRN
  Administered 2014-11-13 (×3): 10 mg via INTRAVENOUS

## 2014-11-13 MED ORDER — SCOPOLAMINE 1 MG/3DAYS TD PT72: 1.0000 | MEDICATED_PATCH | Freq: Once | TRANSDERMAL | Status: DC | PRN

## 2014-11-13 MED ORDER — ONDANSETRON HCL 4 MG PO TABS: 4.0000 mg | ORAL_TABLET | Freq: Three times a day (TID) | ORAL | Status: DC | PRN

## 2014-11-13 MED ORDER — LACTATED RINGERS IV SOLN: INTRAVENOUS | Status: DC

## 2014-11-13 MED ORDER — BUPIVACAINE-EPINEPHRINE (PF) 0.5% -1:200000 IJ SOLN
INTRAMUSCULAR | Status: DC | PRN
  Administered 2014-11-13: 30 mL via PERINEURAL

## 2014-11-13 MED ORDER — ONDANSETRON HCL 4 MG PO TABS: 4.0000 mg | ORAL_TABLET | Freq: Four times a day (QID) | ORAL | Status: DC | PRN

## 2014-11-13 MED ORDER — SUCCINYLCHOLINE CHLORIDE 20 MG/ML IJ SOLN
INTRAMUSCULAR | Status: DC | PRN
  Administered 2014-11-13: 100 mg via INTRAVENOUS

## 2014-11-13 MED ORDER — MIDAZOLAM HCL 2 MG/2ML IJ SOLN
1.0000 mg | INTRAMUSCULAR | Status: DC | PRN
  Administered 2014-11-13: 2 mg via INTRAVENOUS

## 2014-11-13 MED ORDER — LACTATED RINGERS IV SOLN
INTRAVENOUS | Status: DC
  Administered 2014-11-13: 11:00:00 via INTRAVENOUS

## 2014-11-13 MED ORDER — LIDOCAINE HCL (CARDIAC) 20 MG/ML IV SOLN
INTRAVENOUS | Status: DC | PRN
  Administered 2014-11-13: 50 mg via INTRAVENOUS

## 2014-11-13 MED ORDER — MIDAZOLAM HCL 2 MG/2ML IJ SOLN
INTRAMUSCULAR | Status: AC
  Filled 2014-11-13: qty 2

## 2014-11-13 MED ORDER — PROPOFOL 10 MG/ML IV BOLUS
INTRAVENOUS | Status: DC | PRN
  Administered 2014-11-13: 250 mg via INTRAVENOUS

## 2014-11-13 MED ORDER — METHOCARBAMOL 500 MG PO TABS: 500.0000 mg | ORAL_TABLET | Freq: Four times a day (QID) | ORAL | Status: DC | PRN

## 2014-11-13 SURGICAL SUPPLY — 75 items
ANCH SUT SWLK 19.1X4.75 VT (Anchor) ×2 IMPLANT
ANCH SUT SWLK 19.1X5.5 CLS EL (Anchor) ×4 IMPLANT
ANCHOR PEEK 4.75X19.1 SWLK C (Anchor) ×3 IMPLANT
ANCHOR PEEK SWIVEL LOCK 5.5 (Anchor) ×6 IMPLANT
APL SKNCLS STERI-STRIP NONHPOA (GAUZE/BANDAGES/DRESSINGS)
BENZOIN TINCTURE PRP APPL 2/3 (GAUZE/BANDAGES/DRESSINGS) IMPLANT
BLADE CUTTER GATOR 3.5 (BLADE) ×4 IMPLANT
BLADE CUTTER MENIS 5.5 (BLADE) IMPLANT
BLADE GREAT WHITE 4.2 (BLADE) ×3 IMPLANT
BLADE GREAT WHITE 4.2MM (BLADE) ×1
BLADE SURG 15 STRL LF DISP TIS (BLADE) IMPLANT
BLADE SURG 15 STRL SS (BLADE)
BUR OVAL 6.0 (BURR) ×4 IMPLANT
CANNULA DRY DOC 8X75 (CANNULA) IMPLANT
CANNULA TWIST IN 8.25X7CM (CANNULA) IMPLANT
CLOSURE WOUND 1/2 X4 (GAUZE/BANDAGES/DRESSINGS)
DECANTER SPIKE VIAL GLASS SM (MISCELLANEOUS) IMPLANT
DRAPE STERI 35X30 U-POUCH (DRAPES) ×4 IMPLANT
DRAPE U-SHAPE 47X51 STRL (DRAPES) ×4 IMPLANT
DRAPE U-SHAPE 76X120 STRL (DRAPES) ×8 IMPLANT
DRSG PAD ABDOMINAL 8X10 ST (GAUZE/BANDAGES/DRESSINGS) ×4 IMPLANT
DURAPREP 26ML APPLICATOR (WOUND CARE) ×4 IMPLANT
ELECT MENISCUS 165MM 90D (ELECTRODE) ×4 IMPLANT
ELECT REM PT RETURN 9FT ADLT (ELECTROSURGICAL) ×4
ELECTRODE REM PT RTRN 9FT ADLT (ELECTROSURGICAL) ×2 IMPLANT
GAUZE SPONGE 4X4 12PLY STRL (GAUZE/BANDAGES/DRESSINGS) ×8 IMPLANT
GAUZE XEROFORM 1X8 LF (GAUZE/BANDAGES/DRESSINGS) ×4 IMPLANT
GLOVE BIOGEL PI IND STRL 7.0 (GLOVE) ×2 IMPLANT
GLOVE BIOGEL PI INDICATOR 7.0 (GLOVE) ×2
GLOVE ECLIPSE 7.0 STRL STRAW (GLOVE) ×4 IMPLANT
GLOVE ORTHO TXT STRL SZ7.5 (GLOVE) ×4 IMPLANT
GLOVE SURG ORTHO 8.0 STRL STRW (GLOVE) ×4 IMPLANT
GOWN STRL REUS W/ TWL LRG LVL3 (GOWN DISPOSABLE) ×4 IMPLANT
GOWN STRL REUS W/ TWL XL LVL3 (GOWN DISPOSABLE) ×2 IMPLANT
GOWN STRL REUS W/TWL LRG LVL3 (GOWN DISPOSABLE) ×8
GOWN STRL REUS W/TWL XL LVL3 (GOWN DISPOSABLE) ×4
IMMOBILIZER SHOULDER FOAM XLGE (SOFTGOODS) ×3 IMPLANT
IV NS IRRIG 3000ML ARTHROMATIC (IV SOLUTION) ×19 IMPLANT
MANIFOLD NEPTUNE II (INSTRUMENTS) ×4 IMPLANT
NDL SCORPION MULTI FIRE (NEEDLE) IMPLANT
NDL SUT 6 .5 CRC .975X.05 MAYO (NEEDLE) IMPLANT
NEEDLE MAYO TAPER (NEEDLE)
NEEDLE SCORPION MULTI FIRE (NEEDLE) ×4 IMPLANT
PACK ARTHROSCOPY DSU (CUSTOM PROCEDURE TRAY) ×4 IMPLANT
PACK BASIN DAY SURGERY FS (CUSTOM PROCEDURE TRAY) ×4 IMPLANT
PASSER SUT SWANSON 36MM LOOP (INSTRUMENTS) IMPLANT
PENCIL BUTTON HOLSTER BLD 10FT (ELECTRODE) ×4 IMPLANT
SET ARTHROSCOPY TUBING (MISCELLANEOUS) ×4
SET ARTHROSCOPY TUBING LN (MISCELLANEOUS) ×2 IMPLANT
SLEEVE SCD COMPRESS KNEE MED (MISCELLANEOUS) ×3 IMPLANT
SLING ARM IMMOBILIZER LRG (SOFTGOODS) IMPLANT
SLING ARM IMMOBILIZER MED (SOFTGOODS) IMPLANT
SLING ARM LRG ADULT FOAM STRAP (SOFTGOODS) IMPLANT
SLING ARM MED ADULT FOAM STRAP (SOFTGOODS) IMPLANT
SLING ARM XL FOAM STRAP (SOFTGOODS) IMPLANT
SPONGE LAP 4X18 X RAY DECT (DISPOSABLE) IMPLANT
STRIP CLOSURE SKIN 1/2X4 (GAUZE/BANDAGES/DRESSINGS) IMPLANT
SUCTION FRAZIER TIP 10 FR DISP (SUCTIONS) IMPLANT
SUT ETHIBOND 2 OS 4 DA (SUTURE) IMPLANT
SUT ETHILON 2 0 FS 18 (SUTURE) IMPLANT
SUT ETHILON 3 0 PS 1 (SUTURE) IMPLANT
SUT FIBERWIRE #2 38 T-5 BLUE (SUTURE)
SUT RETRIEVER MED (INSTRUMENTS) IMPLANT
SUT TIGER TAPE 7 IN WHITE (SUTURE) ×3 IMPLANT
SUT VIC AB 0 CT1 27 (SUTURE)
SUT VIC AB 0 CT1 27XBRD ANBCTR (SUTURE) IMPLANT
SUT VIC AB 2-0 SH 27 (SUTURE)
SUT VIC AB 2-0 SH 27XBRD (SUTURE) IMPLANT
SUT VIC AB 3-0 FS2 27 (SUTURE) IMPLANT
SUTURE FIBERWR #2 38 T-5 BLUE (SUTURE) IMPLANT
TAPE FIBER 2MM 7IN #2 BLUE (SUTURE) ×3 IMPLANT
TOWEL OR 17X24 6PK STRL BLUE (TOWEL DISPOSABLE) ×4 IMPLANT
TOWEL OR NON WOVEN STRL DISP B (DISPOSABLE) ×1 IMPLANT
WATER STERILE IRR 1000ML POUR (IV SOLUTION) ×4 IMPLANT
YANKAUER SUCT BULB TIP NO VENT (SUCTIONS) IMPLANT

## 2014-11-13 NOTE — Anesthesia Preprocedure Evaluation (Signed)
Anesthesia Evaluation  Patient identified by MRN, date of birth, ID band Patient awake    Reviewed: Allergy & Precautions, NPO status , Patient's Chart, lab work & pertinent test results  Airway Mallampati: I  TM Distance: >3 FB Neck ROM: Full    Dental   Pulmonary    Pulmonary exam normal       Cardiovascular hypertension, Pt. on medications Normal cardiovascular exam    Neuro/Psych    GI/Hepatic   Endo/Other  diabetes, Type 2, Oral Hypoglycemic Agents  Renal/GU      Musculoskeletal   Abdominal   Peds  Hematology   Anesthesia Other Findings   Reproductive/Obstetrics                             Anesthesia Physical Anesthesia Plan  ASA: III  Anesthesia Plan: General   Post-op Pain Management:    Induction: Intravenous  Airway Management Planned: Oral ETT  Additional Equipment:   Intra-op Plan:   Post-operative Plan: Extubation in OR  Informed Consent: I have reviewed the patients History and Physical, chart, labs and discussed the procedure including the risks, benefits and alternatives for the proposed anesthesia with the patient or authorized representative who has indicated his/her understanding and acceptance.     Plan Discussed with: CRNA and Surgeon  Anesthesia Plan Comments:         Anesthesia Quick Evaluation

## 2014-11-13 NOTE — Transfer of Care (Signed)
Immediate Anesthesia Transfer of Care Note  Patient: Christina Cook  Procedure(s) Performed: Procedure(s): SHOULDER ARTHROSCOPY WITH ROTATOR CUFF REPAIR AND SUBACROMIAL DECOMPRESSION (Right) SHOULDER REVISION ACROMIOPLASTY (Right) SHOULDER ARTHROSCOPY WITH DISTAL CLAVICLE EXCISION (Right) SHOULDER ARTHROSCOPY WITH LABRAL AND BICEPS TENDON DEBRIDEMENT  (Right)  Patient Location: PACU  Anesthesia Type:General  Level of Consciousness: awake and sedated  Airway & Oxygen Therapy: Patient Spontanous Breathing and Patient connected to face mask oxygen  Post-op Assessment: Report given to RN and Post -op Vital signs reviewed and stable  Post vital signs: Reviewed and stable  Last Vitals:  Filed Vitals:   11/13/14 1143  BP:   Pulse: 79  Temp:   Resp: 16    Complications: No apparent anesthesia complications

## 2014-11-13 NOTE — Anesthesia Postprocedure Evaluation (Signed)
  Anesthesia Post-op Note  Patient: Christina Cook  Procedure(s) Performed: Procedure(s): SHOULDER ARTHROSCOPY WITH ROTATOR CUFF REPAIR AND SUBACROMIAL DECOMPRESSION (Right) SHOULDER REVISION ACROMIOPLASTY (Right) SHOULDER ARTHROSCOPY WITH DISTAL CLAVICLE EXCISION (Right) SHOULDER ARTHROSCOPY WITH LABRAL AND BICEPS TENDON DEBRIDEMENT  (Right)  Patient Location: PACU  Anesthesia Type: General   Level of Consciousness: awake, alert  and oriented  Airway and Oxygen Therapy: Patient Spontanous Breathing  Post-op Pain: mild  Post-op Assessment: Post-op Vital signs reviewed  Post-op Vital Signs: Reviewed  Last Vitals:  Filed Vitals:   11/13/14 1520  BP: 116/58  Pulse: 87  Temp: 36.6 C  Resp: 18    Complications: No apparent anesthesia complications

## 2014-11-13 NOTE — H&P (View-Only) (Signed)
Christina Cook comes in for evaluation and treatment recommendation for her right shoulder.  Something I have not looked at in the past.  Relatively abrupt onset of symptoms over the last three weeks.  No specific trauma.  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  This is getting worse rather than better.   Previous history of rotator cuff tear treated with an open decompression and cuff repair back in 2008 at [facility].  Recurrent symptoms 3-4 years ago.  MRI evaluation revealing a possible small recurrent tear.  Treated with injection which helped a lot and it has continued to help until recently.  She is coming in specifically to see if we can re-inject her.  Great historian, I don't have old op notes.   History and general exam is outlined and included in the chart.   EXAMINATION: Specifically, I can get her through just about full motion, right shoulder.  Positive impingement.  Positive palms down abduction.  Definite bursitis.  A little give way weakness.  Biceps intact.  A little sore over the [facility] joint.  No instability.  Opposite left shoulder has good motion and good function.    X-RAYS: Three views of the right shoulder show moderate degenerative changes in the shoulder, not extreme.  Type I acromion which I think is a marked improvement from what she described she had before.  There are still considerable degenerative changes and spurring AC joint.  Reasonable subacromial space.     DISPOSITION:  I am concerned about progression of a tear, but she has done well after her shot four years ago until recently.  We are going to repeat her injection.  I went over a Jobe exercise program.  If this does not allow marked improvement she is to call and I will get an MR arthrogram to assess her cuff.  If she is improved she will let me know.  My plan is to follow up with her in November of this year for routine follow up for her knee replacement.    PROCEDURE NOTE: The  patient's clinical condition is marked by substantial pain and/or significant functional disability.  Other conservative therapy has not provided relief, is contraindicated, or not appropriate.  There is a reasonable likelihood that injection will significantly improve the patient's pain and/or functional disability. After appropriate consent and under sterile technique subacromial injection of the right shoulder with Depo-Medrol/Marcaine.  Tolerated this well.  Good relief with numbing medicine in place.    Christina Cook, M.D.     AddendumCoryn Cook is a 64 year-old female who presents to our clinic to discuss her right shoulder MR arthrogram.  Currently has a history of right shoulder rotator cuff repair back in 2008.  She was doing well up until 3-4 years ago for recurrent symptoms.  MRI evaluation at that point in time revealed a possible small recurrent tear.  It was treated with subacromial injection, which helped until about one month ago.  When we saw her on March 22nd of this past year we repeated the subacromial injection.  Christina Cook states she had absolutely no relief, even with Marcaine in place.  We then proceeded with an MR arthrogram because of her significant history of her right shoulder.  The results of the MR arthrogram from August 13, 2014 revealed a partial thickness articular surface tear involving the infraspinatus, supraspinatus junction region with approximately 14 mm retraction of the articular fibers.  No full thickness tear identified.  Significant subscapularis tendinopathy and partial thickness tearing.  Moderate tendinopathy biceps tendon and superior labral degenerative changes, but no discreet labral tears.  Because Christina Cook does admit to having a little bit of relief of pain two days following the MR arthrogram, but this is really starting to diminish.    PLAN: Today we discussed surgical intervention with a right shoulder decompression and probably rotator cuff repair.   Christina Cook has an upcoming family trip planned to [facility] on June 20th and would like to put this off until she returns home.  This is okay with [place].  Today we went ahead and fill out paperwork.  Risks, benefits and possible complications of surgery were reviewed.  Rehab and recovery time discussed.  All questions were answered.  Christina Cook is to most likely be out of work for about six weeks following surgery.  We told her that if she would like to come in prior to her trip to [facility] she can and we can inject her shoulder intraarticularly where she would most likely get relief for a short period of time.     Christina Cook, M.D.

## 2014-11-13 NOTE — Anesthesia Procedure Notes (Addendum)
Anesthesia Regional Block:  Interscalene brachial plexus block  Pre-Anesthetic Checklist: ,, timeout performed, Correct Patient, Correct Site, Correct Laterality, Correct Procedure, Correct Position, site marked, Risks and benefits discussed,  Surgical consent,  Pre-op evaluation,  At surgeon's request and post-op pain management  Laterality: Right  Prep: chloraprep       Needles:  Injection technique: Single-shot  Needle Type: Other     Needle Length: 9cm 9 cm Needle Gauge: 21 and 21 G  Needle insertion depth: 5 cm   Additional Needles:  Procedures: nerve stimulator Interscalene brachial plexus block Narrative:  Start time: 11/13/2014 11:30 AM End time: 11/13/2014 11:40 AM Injection made incrementally with aspirations every 5 mL.  Performed by: Personally  Anesthesiologist: Lillia Abed  Additional Notes: Monitors applied. Patient sedated. Sterile prep and drape,hand hygiene and sterile gloves were used. Needle position confirmed with evoked response at 0.4 mV.Local anesthetic injected incrementally after negative aspiration.Vascular puncture avoided. No complications. The patient tolerated the procedure well.        Procedure Name: Intubation Performed by: Terrance Mass Pre-anesthesia Checklist: Patient identified, Timeout performed, Emergency Drugs available, Suction available and Patient being monitored Patient Re-evaluated:Patient Re-evaluated prior to inductionOxygen Delivery Method: Circle system utilized Preoxygenation: Pre-oxygenation with 100% oxygen Intubation Type: IV induction Ventilation: Mask ventilation without difficulty Laryngoscope Size: Miller and 2 Grade View: Grade I Tube type: Oral Tube size: 7.0 mm Number of attempts: 1 Airway Equipment and Method: Stylet Placement Confirmation: ETT inserted through vocal cords under direct vision,  positive ETCO2 and breath sounds checked- equal and bilateral Secured at: 22 cm Tube secured with:  Tape Dental Injury: Teeth and Oropharynx as per pre-operative assessment

## 2014-11-13 NOTE — Discharge Instructions (Addendum)
Shouder arthroscopy, rotator cuff repair, subacromial decompression °Care After Instructions °Refer to this sheet in the next few weeks. These discharge instructions provide you with general information on caring for yourself after you leave the hospital. Your caregiver may also give you specific instructions. Your treatment has been planned according to the most current medical practices available, but unavoidable complications sometimes occur. If you have any problems or questions after discharge, please call your caregiver. °HOME INSTRUCTIONS °You may resume a normal diet and activities as directed.  °Take showers instead of baths until informed otherwise.  °Change bandages (dressings) in 3 days.  Swab wounds daily with betadine.  Wash shoulder with soap and water.  Pat dry.  Cover wounds with bandaids. °Only take over-the-counter or prescription medicines for pain, discomfort, or fever as directed by your caregiver.  °Wear your sling for the next 6 weeks unless otherwise instructed. °Eat a well-balanced diet.  °Avoid lifting or driving until you are instructed otherwise.  °Make an appointment to see your caregiver for stitches (suture) or staple removal one week after surgery.  ° °SEEK MEDICAL CARE IF: °You have swelling of your calf or leg.  °You develop shortness of breath or chest pain.  °You have redness, swelling, or increasing pain in the wound.  °There is pus or any unusual drainage coming from the surgical site.  °You notice a bad smell coming from the surgical site or dressing.  °The surgical site breaks open after sutures or staples have been removed.  °There is persistent bleeding from the suture or staple line.  °You are getting worse or are not improving.  °You have any other questions or concerns.  °SEEK IMMEDIATE MEDICAL CARE IF:  °You have a fever greater than 101 °You develop a rash.  °You have difficulty breathing.  °You develop any reaction or side effects to medicines given.  °Your knee  motion is decreasing rather than improving.  °MAKE SURE YOU:  °Understand these instructions.  °Will watch your condition.  °Will get help right away if you are not doing well or get worse.  ° ° ° °Regional Anesthesia Blocks ° °1. Numbness or the inability to move the "blocked" extremity may last from 3-48 hours after placement. The length of time depends on the medication injected and your individual response to the medication. If the numbness is not going away after 48 hours, call your surgeon. ° °2. The extremity that is blocked will need to be protected until the numbness is gone and the  Strength has returned. Because you cannot feel it, you will need to take extra care to avoid injury. Because it may be weak, you may have difficulty moving it or using it. You may not know what position it is in without looking at it while the block is in effect. ° °3. For blocks in the legs and feet, returning to weight bearing and walking needs to be done carefully. You will need to wait until the numbness is entirely gone and the strength has returned. You should be able to move your leg and foot normally before you try and bear weight or walk. You will need someone to be with you when you first try to ensure you do not fall and possibly risk injury. ° °4. Bruising and tenderness at the needle site are common side effects and will resolve in a few days. ° °5. Persistent numbness or new problems with movement should be communicated to the surgeon or the [facility] (  [phone])/ [facility] ([phone]). ° ° ° ° ° ° °Post Anesthesia Home Care Instructions ° °Activity: °Get plenty of rest for the remainder of the day. A responsible adult should stay with you for 24 hours following the procedure.  °For the next 24 hours, DO NOT: °-Drive a car °-Operate machinery °-Drink alcoholic beverages °-Take any medication unless instructed by your physician °-Make any legal decisions or sign important  papers. ° °Meals: °Start with liquid foods such as gelatin or soup. Progress to regular foods as tolerated. Avoid greasy, spicy, heavy foods. If nausea and/or vomiting occur, drink only clear liquids until the nausea and/or vomiting subsides. Call your physician if vomiting continues. ° °Special Instructions/Symptoms: °Your throat may feel dry or sore from the anesthesia or the breathing tube placed in your throat during surgery. If this causes discomfort, gargle with warm salt water. The discomfort should disappear within 24 hours. ° °If you had a scopolamine patch placed behind your ear for the management of post- operative nausea and/or vomiting: ° °1. The medication in the patch is effective for 72 hours, after which it should be removed.  Wrap patch in a tissue and discard in the trash. Wash hands thoroughly with soap and water. °2. You may remove the patch earlier than 72 hours if you experience unpleasant side effects which may include dry mouth, dizziness or visual disturbances. °3. Avoid touching the patch. Wash your hands with soap and water after contact with the patch. °  ° °

## 2014-11-13 NOTE — Interval H&P Note (Signed)
History and Physical Interval Note:  11/13/2014 7:35 AM  Christina Cook  has presented today for surgery, with the diagnosis of other articular cartilage disorders, right shoulder, impingement syndrome of right shoulder, strain of muscle(s) and tendon(s) of the rotator cuff of right shoudler, initial encounter, bicipital  The various methods of treatment have been discussed with the patient and family. After consideration of risks, benefits and other options for treatment, the patient has consented to  Procedure(s): RIGHT SHOULDER ARTHROSCOPY WITH DEBRIDEMENT, DISTAL CLAVICLE EXCISION AND BICEPS TENDODESIS (Right) as a surgical intervention .  The patient's history has been reviewed, patient examined, no change in status, stable for surgery.  I have reviewed the patient's chart and labs.  Questions were answered to the patient's satisfaction.     Arisbeth Purrington F

## 2014-11-14 ENCOUNTER — Encounter (HOSPITAL_BASED_OUTPATIENT_CLINIC_OR_DEPARTMENT_OTHER): Payer: Self-pay | Admitting: Orthopedic Surgery

## 2014-11-14 NOTE — Op Note (Signed)
Christina Cook, Christina Cook               ACCOUNT NO.:  [id number]  MEDICAL RECORD NO.:  [id number]  LOCATION:                               FACILITY:  [facility]  PHYSICIAN:  Ninetta Lights, M.D. 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  Acromioplasty with partial acromionectomy.  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  Acromioplasty with partial acromionectomy with retained suture, attritional tear throughout the crescent region, supraspinatus, and top of the infraspinatus.  Torn long head biceps tendon with a large retained stump.  Circumferential tearing, labrum.  Grade 4 DJD, AC joint.  Recurrent subacromial bursitis, adhesions, and mild impingement.  PROCEDURE:  Right shoulder exam under anesthesia, arthroscopy. Debridement of stump of biceps.  Debridement of labrum.  Debridement and mobilization of rotator cuff.  Removal of loose sutures.  Revision of acromioplasty, bursectomy, lysis and debridement of adhesions.  Excision of distal clavicle.  Arthroscopic-assisted rotator cuff repair with FiberWire suture x2, SwiveLock anchors x2.  SURGEON:  Ninetta Lights, M.D.  ASSISTANT:  Elmyra Ricks, P.A., presented throughout the entire case and necessary for timely completion of procedure.  ANESTHESIA:  General.  BLOOD LOSS:  Minimal.  SPECIMENS:  None.  CULTURES:  None.  COMPLICATIONS:  None.  DRESSINGS:  Soft compressive shoulder immobilizer.  PROCEDURE IN DETAIL:  The patient was brought to operating room, placed on the operating table in supine position.  After adequate anesthesia had been obtained, the shoulder was examined.  It lacked about 10% of motion.  I manipulated, breaking up adhesions, full motion,  stable shoulder.  Placed in a beach chair position on the shoulder positioner, prepped and draped in usual sterile fashion.  Three portals; anterior, posterior, and lateral.  Arthroscope was introduced, shoulder distended and inspected.  Circumferential labral tear was debrided.  A large retained stump of the long head of the biceps debrided.  The cuff was assessed with a functional full-thickness tear throughout the crescent region, supraspinatus, and on the top of infraspinatus. Debrided and mobilized.  Cannula redirected subacromially.  Bursitis, adhesions removed.  I did a mild revision acromioplasty from recurrent spurs of the fontanelle that were by the [facility] joint.  Care taken not to remove any more the front of the acromion as a generous acromionectomy had already been done.  Grade 4 changes in the [facility] joint.  Periarticular spurs as well as subacromial flap were resected.  With a lateral cannula at the end, I was able to capture the mobilized cuff with 2 horizontal mattress sutures with a Scorpion device.  I firmly implanted down the tuberosity with 2 SwiveLock anchors.  I initially tried to use a smaller SwiveLock which did not hold, and  I converted both to larger SwiveLock anchors.  At completion, I had an adequate decompression and a good, firm repair of the cuff, although the cuff tissue was somewhat suspect.  Instruments were fully removed. Portals were closed with nylon.  Sterile compressive dressing  applied. Sling applied.  Anesthesia reversed.  Brought to the recovery room. Tolerated the surgery well.  No complications.     Ninetta Lights, M.D.   ______________________________ Ninetta Lights, M.D.    DFM/MEDQ  D:  11/13/2014  T:  11/14/2014  Job:  [id number]

## 2014-11-24 ENCOUNTER — Encounter (HOSPITAL_BASED_OUTPATIENT_CLINIC_OR_DEPARTMENT_OTHER): Payer: Self-pay | Admitting: Orthopedic Surgery

## 2015-02-25 ENCOUNTER — Other Ambulatory Visit: Payer: Self-pay

## 2015-02-25 DIAGNOSIS — Z1231 Encounter for screening mammogram for malignant neoplasm of breast: Secondary | ICD-10-CM

## 2015-03-12 ENCOUNTER — Ambulatory Visit
Admission: RE | Admit: 2015-03-12 | Discharge: 2015-03-12 | Disposition: A | Discharge status: Home / Self Care | Payer: 59 | Source: Ambulatory Visit

## 2015-03-12 DIAGNOSIS — Z1231 Encounter for screening mammogram for malignant neoplasm of breast: Secondary | ICD-10-CM

## 2015-03-16 ENCOUNTER — Other Ambulatory Visit: Payer: Self-pay | Admitting: Advanced Practice Midwife

## 2015-03-16 DIAGNOSIS — R928 Other abnormal and inconclusive findings on diagnostic imaging of breast: Secondary | ICD-10-CM

## 2015-03-19 ENCOUNTER — Other Ambulatory Visit: Payer: Self-pay | Admitting: Family Medicine

## 2015-03-19 DIAGNOSIS — R928 Other abnormal and inconclusive findings on diagnostic imaging of breast: Secondary | ICD-10-CM

## 2015-03-23 ENCOUNTER — Ambulatory Visit
Admission: RE | Admit: 2015-03-23 | Discharge: 2015-03-23 | Disposition: A | Discharge status: Home / Self Care | Payer: 59 | Source: Ambulatory Visit | Attending: Advanced Practice Midwife | Admitting: Advanced Practice Midwife

## 2015-03-23 DIAGNOSIS — R928 Other abnormal and inconclusive findings on diagnostic imaging of breast: Secondary | ICD-10-CM

## 2015-09-15 ENCOUNTER — Other Ambulatory Visit: Payer: Self-pay | Admitting: Family Medicine

## 2015-09-15 DIAGNOSIS — R945 Abnormal results of liver function studies: Secondary | ICD-10-CM

## 2015-09-21 ENCOUNTER — Ambulatory Visit
Admission: RE | Admit: 2015-09-21 | Discharge: 2015-09-21 | Disposition: A | Discharge status: Home / Self Care | Payer: 59 | Source: Ambulatory Visit | Attending: Family Medicine | Admitting: Family Medicine

## 2015-09-21 DIAGNOSIS — R945 Abnormal results of liver function studies: Secondary | ICD-10-CM

## 2016-02-19 ENCOUNTER — Other Ambulatory Visit: Payer: Self-pay | Admitting: Family Medicine

## 2016-02-19 DIAGNOSIS — Z1231 Encounter for screening mammogram for malignant neoplasm of breast: Secondary | ICD-10-CM

## 2016-03-22 ENCOUNTER — Ambulatory Visit
Admission: RE | Admit: 2016-03-22 | Discharge: 2016-03-22 | Disposition: A | Discharge status: Home / Self Care | Payer: 59 | Source: Ambulatory Visit | Attending: Family Medicine | Admitting: Family Medicine

## 2016-03-22 DIAGNOSIS — Z1231 Encounter for screening mammogram for malignant neoplasm of breast: Secondary | ICD-10-CM

## 2016-04-01 ENCOUNTER — Ambulatory Visit: Payer: 59

## 2017-05-15 ENCOUNTER — Other Ambulatory Visit: Payer: Self-pay | Admitting: Family Medicine

## 2017-05-15 DIAGNOSIS — Z1231 Encounter for screening mammogram for malignant neoplasm of breast: Secondary | ICD-10-CM

## 2017-08-28 ENCOUNTER — Ambulatory Visit
Admission: RE | Admit: 2017-08-28 | Discharge: 2017-08-28 | Disposition: A | Discharge status: Home / Self Care | Payer: PPO | Source: Ambulatory Visit | Attending: Family Medicine | Admitting: Family Medicine

## 2017-08-28 DIAGNOSIS — Z1231 Encounter for screening mammogram for malignant neoplasm of breast: Secondary | ICD-10-CM

## 2017-08-31 DIAGNOSIS — N183 Chronic kidney disease, stage 3 (moderate): Secondary | ICD-10-CM | POA: Diagnosis not present

## 2017-08-31 DIAGNOSIS — E782 Mixed hyperlipidemia: Secondary | ICD-10-CM | POA: Diagnosis not present

## 2017-08-31 DIAGNOSIS — E1121 Type 2 diabetes mellitus with diabetic nephropathy: Secondary | ICD-10-CM | POA: Diagnosis not present

## 2017-08-31 DIAGNOSIS — I129 Hypertensive chronic kidney disease with stage 1 through stage 4 chronic kidney disease, or unspecified chronic kidney disease: Secondary | ICD-10-CM | POA: Diagnosis not present

## 2017-08-31 DIAGNOSIS — Z7984 Long term (current) use of oral hypoglycemic drugs: Secondary | ICD-10-CM | POA: Diagnosis not present

## 2017-08-31 DIAGNOSIS — Z6838 Body mass index (BMI) 38.0-38.9, adult: Secondary | ICD-10-CM | POA: Diagnosis not present

## 2018-03-02 ENCOUNTER — Other Ambulatory Visit: Payer: Self-pay | Admitting: Family Medicine

## 2018-03-02 DIAGNOSIS — Z6835 Body mass index (BMI) 35.0-35.9, adult: Secondary | ICD-10-CM | POA: Diagnosis not present

## 2018-03-02 DIAGNOSIS — E1121 Type 2 diabetes mellitus with diabetic nephropathy: Secondary | ICD-10-CM | POA: Diagnosis not present

## 2018-03-02 DIAGNOSIS — F322 Major depressive disorder, single episode, severe without psychotic features: Secondary | ICD-10-CM | POA: Diagnosis not present

## 2018-03-02 DIAGNOSIS — E782 Mixed hyperlipidemia: Secondary | ICD-10-CM | POA: Diagnosis not present

## 2018-03-02 DIAGNOSIS — N183 Chronic kidney disease, stage 3 (moderate): Secondary | ICD-10-CM | POA: Diagnosis not present

## 2018-03-02 DIAGNOSIS — I129 Hypertensive chronic kidney disease with stage 1 through stage 4 chronic kidney disease, or unspecified chronic kidney disease: Secondary | ICD-10-CM | POA: Diagnosis not present

## 2018-03-02 DIAGNOSIS — E669 Obesity, unspecified: Secondary | ICD-10-CM | POA: Diagnosis not present

## 2018-03-02 DIAGNOSIS — Z0000 Encounter for general adult medical examination without abnormal findings: Secondary | ICD-10-CM | POA: Diagnosis not present

## 2018-03-02 DIAGNOSIS — R5381 Other malaise: Secondary | ICD-10-CM

## 2018-03-02 DIAGNOSIS — Z7984 Long term (current) use of oral hypoglycemic drugs: Secondary | ICD-10-CM | POA: Diagnosis not present

## 2018-03-02 DIAGNOSIS — Z23 Encounter for immunization: Secondary | ICD-10-CM | POA: Diagnosis not present

## 2018-03-06 ENCOUNTER — Other Ambulatory Visit: Payer: Self-pay | Admitting: Family Medicine

## 2018-03-06 DIAGNOSIS — E2839 Other primary ovarian failure: Secondary | ICD-10-CM

## 2018-04-11 ENCOUNTER — Other Ambulatory Visit: Payer: Self-pay | Admitting: Family Medicine

## 2018-04-11 ENCOUNTER — Ambulatory Visit
Admission: RE | Admit: 2018-04-11 | Discharge: 2018-04-11 | Disposition: A | Discharge status: Home / Self Care | Payer: PPO | Source: Ambulatory Visit | Attending: Family Medicine | Admitting: Family Medicine

## 2018-04-11 DIAGNOSIS — Z13820 Encounter for screening for osteoporosis: Secondary | ICD-10-CM | POA: Diagnosis not present

## 2018-04-11 DIAGNOSIS — Z1231 Encounter for screening mammogram for malignant neoplasm of breast: Secondary | ICD-10-CM

## 2018-04-11 DIAGNOSIS — Z780 Asymptomatic menopausal state: Secondary | ICD-10-CM | POA: Diagnosis not present

## 2018-04-11 DIAGNOSIS — E2839 Other primary ovarian failure: Secondary | ICD-10-CM

## 2018-05-10 ENCOUNTER — Other Ambulatory Visit: Payer: PPO

## 2018-05-24 DIAGNOSIS — N898 Other specified noninflammatory disorders of vagina: Secondary | ICD-10-CM | POA: Diagnosis not present

## 2018-08-29 DIAGNOSIS — Z6833 Body mass index (BMI) 33.0-33.9, adult: Secondary | ICD-10-CM | POA: Diagnosis not present

## 2018-08-29 DIAGNOSIS — I129 Hypertensive chronic kidney disease with stage 1 through stage 4 chronic kidney disease, or unspecified chronic kidney disease: Secondary | ICD-10-CM | POA: Diagnosis not present

## 2018-08-29 DIAGNOSIS — E669 Obesity, unspecified: Secondary | ICD-10-CM | POA: Diagnosis not present

## 2018-08-29 DIAGNOSIS — F322 Major depressive disorder, single episode, severe without psychotic features: Secondary | ICD-10-CM | POA: Diagnosis not present

## 2018-08-29 DIAGNOSIS — E1121 Type 2 diabetes mellitus with diabetic nephropathy: Secondary | ICD-10-CM | POA: Diagnosis not present

## 2018-08-29 DIAGNOSIS — Z7984 Long term (current) use of oral hypoglycemic drugs: Secondary | ICD-10-CM | POA: Diagnosis not present

## 2018-08-29 DIAGNOSIS — N183 Chronic kidney disease, stage 3 (moderate): Secondary | ICD-10-CM | POA: Diagnosis not present

## 2018-08-29 DIAGNOSIS — E782 Mixed hyperlipidemia: Secondary | ICD-10-CM | POA: Diagnosis not present

## 2018-08-31 ENCOUNTER — Ambulatory Visit: Payer: PPO

## 2018-10-12 ENCOUNTER — Ambulatory Visit: Payer: PPO

## 2018-12-04 ENCOUNTER — Other Ambulatory Visit: Payer: Self-pay

## 2018-12-04 ENCOUNTER — Ambulatory Visit
Admission: RE | Admit: 2018-12-04 | Discharge: 2018-12-04 | Disposition: A | Discharge status: Home / Self Care | Payer: PPO | Source: Ambulatory Visit | Attending: Family Medicine | Admitting: Family Medicine

## 2018-12-04 DIAGNOSIS — Z1231 Encounter for screening mammogram for malignant neoplasm of breast: Secondary | ICD-10-CM | POA: Diagnosis not present

## 2019-03-06 DIAGNOSIS — N183 Chronic kidney disease, stage 3 unspecified: Secondary | ICD-10-CM | POA: Diagnosis not present

## 2019-03-06 DIAGNOSIS — E1121 Type 2 diabetes mellitus with diabetic nephropathy: Secondary | ICD-10-CM | POA: Diagnosis not present

## 2019-03-06 DIAGNOSIS — I129 Hypertensive chronic kidney disease with stage 1 through stage 4 chronic kidney disease, or unspecified chronic kidney disease: Secondary | ICD-10-CM | POA: Diagnosis not present

## 2019-03-06 DIAGNOSIS — E782 Mixed hyperlipidemia: Secondary | ICD-10-CM | POA: Diagnosis not present

## 2019-03-06 DIAGNOSIS — Z7984 Long term (current) use of oral hypoglycemic drugs: Secondary | ICD-10-CM | POA: Diagnosis not present

## 2019-03-08 DIAGNOSIS — N183 Chronic kidney disease, stage 3 unspecified: Secondary | ICD-10-CM | POA: Diagnosis not present

## 2019-03-08 DIAGNOSIS — Z6831 Body mass index (BMI) 31.0-31.9, adult: Secondary | ICD-10-CM | POA: Diagnosis not present

## 2019-03-08 DIAGNOSIS — E1121 Type 2 diabetes mellitus with diabetic nephropathy: Secondary | ICD-10-CM | POA: Diagnosis not present

## 2019-03-08 DIAGNOSIS — Z7189 Other specified counseling: Secondary | ICD-10-CM | POA: Diagnosis not present

## 2019-03-08 DIAGNOSIS — I129 Hypertensive chronic kidney disease with stage 1 through stage 4 chronic kidney disease, or unspecified chronic kidney disease: Secondary | ICD-10-CM | POA: Diagnosis not present

## 2019-03-08 DIAGNOSIS — E669 Obesity, unspecified: Secondary | ICD-10-CM | POA: Diagnosis not present

## 2019-03-08 DIAGNOSIS — E782 Mixed hyperlipidemia: Secondary | ICD-10-CM | POA: Diagnosis not present

## 2019-03-08 DIAGNOSIS — Z0000 Encounter for general adult medical examination without abnormal findings: Secondary | ICD-10-CM | POA: Diagnosis not present

## 2019-03-08 DIAGNOSIS — Z7984 Long term (current) use of oral hypoglycemic drugs: Secondary | ICD-10-CM | POA: Diagnosis not present

## 2019-03-08 DIAGNOSIS — F322 Major depressive disorder, single episode, severe without psychotic features: Secondary | ICD-10-CM | POA: Diagnosis not present

## 2019-09-06 DIAGNOSIS — E1121 Type 2 diabetes mellitus with diabetic nephropathy: Secondary | ICD-10-CM | POA: Diagnosis not present

## 2019-09-06 DIAGNOSIS — N183 Chronic kidney disease, stage 3 unspecified: Secondary | ICD-10-CM | POA: Diagnosis not present

## 2019-09-06 DIAGNOSIS — B351 Tinea unguium: Secondary | ICD-10-CM | POA: Diagnosis not present

## 2019-09-06 DIAGNOSIS — I129 Hypertensive chronic kidney disease with stage 1 through stage 4 chronic kidney disease, or unspecified chronic kidney disease: Secondary | ICD-10-CM | POA: Diagnosis not present

## 2019-09-06 DIAGNOSIS — E782 Mixed hyperlipidemia: Secondary | ICD-10-CM | POA: Diagnosis not present

## 2019-10-07 DIAGNOSIS — I129 Hypertensive chronic kidney disease with stage 1 through stage 4 chronic kidney disease, or unspecified chronic kidney disease: Secondary | ICD-10-CM | POA: Diagnosis not present

## 2019-11-06 DIAGNOSIS — I129 Hypertensive chronic kidney disease with stage 1 through stage 4 chronic kidney disease, or unspecified chronic kidney disease: Secondary | ICD-10-CM | POA: Diagnosis not present

## 2019-11-14 ENCOUNTER — Other Ambulatory Visit: Payer: Self-pay | Admitting: Physician Assistant

## 2019-11-14 ENCOUNTER — Other Ambulatory Visit: Payer: Self-pay | Admitting: Advanced Practice Midwife

## 2019-11-14 DIAGNOSIS — Z1231 Encounter for screening mammogram for malignant neoplasm of breast: Secondary | ICD-10-CM

## 2019-12-05 ENCOUNTER — Ambulatory Visit
Admission: RE | Admit: 2019-12-05 | Discharge: 2019-12-05 | Disposition: A | Discharge status: Home / Self Care | Payer: PPO | Source: Ambulatory Visit | Attending: Advanced Practice Midwife | Admitting: Advanced Practice Midwife

## 2019-12-05 ENCOUNTER — Other Ambulatory Visit: Payer: Self-pay

## 2019-12-05 DIAGNOSIS — I129 Hypertensive chronic kidney disease with stage 1 through stage 4 chronic kidney disease, or unspecified chronic kidney disease: Secondary | ICD-10-CM | POA: Diagnosis not present

## 2019-12-05 DIAGNOSIS — Z1231 Encounter for screening mammogram for malignant neoplasm of breast: Secondary | ICD-10-CM

## 2019-12-05 DIAGNOSIS — N183 Chronic kidney disease, stage 3 unspecified: Secondary | ICD-10-CM | POA: Diagnosis not present

## 2019-12-05 DIAGNOSIS — E1121 Type 2 diabetes mellitus with diabetic nephropathy: Secondary | ICD-10-CM | POA: Diagnosis not present

## 2019-12-05 DIAGNOSIS — E782 Mixed hyperlipidemia: Secondary | ICD-10-CM | POA: Diagnosis not present

## 2019-12-05 DIAGNOSIS — Z7984 Long term (current) use of oral hypoglycemic drugs: Secondary | ICD-10-CM | POA: Diagnosis not present

## 2019-12-09 ENCOUNTER — Other Ambulatory Visit: Payer: Self-pay | Admitting: Advanced Practice Midwife

## 2019-12-09 DIAGNOSIS — R928 Other abnormal and inconclusive findings on diagnostic imaging of breast: Secondary | ICD-10-CM

## 2019-12-17 DIAGNOSIS — E119 Type 2 diabetes mellitus without complications: Secondary | ICD-10-CM | POA: Diagnosis not present

## 2019-12-17 DIAGNOSIS — H40012 Open angle with borderline findings, low risk, left eye: Secondary | ICD-10-CM | POA: Diagnosis not present

## 2019-12-17 DIAGNOSIS — H5211 Myopia, right eye: Secondary | ICD-10-CM | POA: Diagnosis not present

## 2019-12-17 DIAGNOSIS — Z961 Presence of intraocular lens: Secondary | ICD-10-CM | POA: Diagnosis not present

## 2019-12-18 ENCOUNTER — Other Ambulatory Visit: Payer: Self-pay | Admitting: Family Medicine

## 2019-12-18 DIAGNOSIS — R928 Other abnormal and inconclusive findings on diagnostic imaging of breast: Secondary | ICD-10-CM

## 2019-12-20 ENCOUNTER — Other Ambulatory Visit: Payer: Self-pay

## 2019-12-20 ENCOUNTER — Ambulatory Visit
Admission: RE | Admit: 2019-12-20 | Discharge: 2019-12-20 | Disposition: A | Discharge status: Home / Self Care | Payer: PPO | Source: Ambulatory Visit | Attending: Advanced Practice Midwife | Admitting: Advanced Practice Midwife

## 2019-12-20 ENCOUNTER — Other Ambulatory Visit: Payer: Self-pay | Admitting: Family Medicine

## 2019-12-20 DIAGNOSIS — R928 Other abnormal and inconclusive findings on diagnostic imaging of breast: Secondary | ICD-10-CM

## 2019-12-20 DIAGNOSIS — R921 Mammographic calcification found on diagnostic imaging of breast: Secondary | ICD-10-CM | POA: Diagnosis not present

## 2020-04-08 DIAGNOSIS — F322 Major depressive disorder, single episode, severe without psychotic features: Secondary | ICD-10-CM | POA: Diagnosis not present

## 2020-04-08 DIAGNOSIS — E1121 Type 2 diabetes mellitus with diabetic nephropathy: Secondary | ICD-10-CM | POA: Diagnosis not present

## 2020-04-08 DIAGNOSIS — Z85528 Personal history of other malignant neoplasm of kidney: Secondary | ICD-10-CM | POA: Diagnosis not present

## 2020-04-08 DIAGNOSIS — E782 Mixed hyperlipidemia: Secondary | ICD-10-CM | POA: Diagnosis not present

## 2020-04-08 DIAGNOSIS — I129 Hypertensive chronic kidney disease with stage 1 through stage 4 chronic kidney disease, or unspecified chronic kidney disease: Secondary | ICD-10-CM | POA: Diagnosis not present

## 2020-04-08 DIAGNOSIS — N183 Chronic kidney disease, stage 3 unspecified: Secondary | ICD-10-CM | POA: Diagnosis not present

## 2020-04-08 DIAGNOSIS — Z0000 Encounter for general adult medical examination without abnormal findings: Secondary | ICD-10-CM | POA: Diagnosis not present

## 2020-06-12 DIAGNOSIS — H01001 Unspecified blepharitis right upper eyelid: Secondary | ICD-10-CM | POA: Diagnosis not present

## 2020-06-12 DIAGNOSIS — H0015 Chalazion left lower eyelid: Secondary | ICD-10-CM | POA: Diagnosis not present

## 2020-06-12 DIAGNOSIS — H01004 Unspecified blepharitis left upper eyelid: Secondary | ICD-10-CM | POA: Diagnosis not present

## 2020-08-03 DIAGNOSIS — B029 Zoster without complications: Secondary | ICD-10-CM | POA: Diagnosis not present

## 2020-10-05 DIAGNOSIS — E782 Mixed hyperlipidemia: Secondary | ICD-10-CM | POA: Diagnosis not present

## 2020-10-05 DIAGNOSIS — N3289 Other specified disorders of bladder: Secondary | ICD-10-CM | POA: Diagnosis not present

## 2020-10-05 DIAGNOSIS — N760 Acute vaginitis: Secondary | ICD-10-CM | POA: Diagnosis not present

## 2020-10-05 DIAGNOSIS — I129 Hypertensive chronic kidney disease with stage 1 through stage 4 chronic kidney disease, or unspecified chronic kidney disease: Secondary | ICD-10-CM | POA: Diagnosis not present

## 2020-10-05 DIAGNOSIS — E1121 Type 2 diabetes mellitus with diabetic nephropathy: Secondary | ICD-10-CM | POA: Diagnosis not present

## 2020-11-09 ENCOUNTER — Other Ambulatory Visit: Payer: Self-pay | Admitting: Family Medicine

## 2020-11-09 DIAGNOSIS — R921 Mammographic calcification found on diagnostic imaging of breast: Secondary | ICD-10-CM

## 2020-12-14 DIAGNOSIS — H5213 Myopia, bilateral: Secondary | ICD-10-CM | POA: Diagnosis not present

## 2020-12-14 DIAGNOSIS — E119 Type 2 diabetes mellitus without complications: Secondary | ICD-10-CM | POA: Diagnosis not present

## 2020-12-14 DIAGNOSIS — Z961 Presence of intraocular lens: Secondary | ICD-10-CM | POA: Diagnosis not present

## 2020-12-14 DIAGNOSIS — H5010 Unspecified exotropia: Secondary | ICD-10-CM | POA: Diagnosis not present

## 2020-12-24 ENCOUNTER — Other Ambulatory Visit: Payer: Self-pay | Admitting: Family Medicine

## 2020-12-24 ENCOUNTER — Other Ambulatory Visit: Payer: Self-pay

## 2020-12-24 ENCOUNTER — Ambulatory Visit
Admission: RE | Admit: 2020-12-24 | Discharge: 2020-12-24 | Disposition: A | Discharge status: Home / Self Care | Payer: PPO | Source: Ambulatory Visit | Attending: Family Medicine | Admitting: Family Medicine

## 2020-12-24 DIAGNOSIS — R921 Mammographic calcification found on diagnostic imaging of breast: Secondary | ICD-10-CM

## 2020-12-24 DIAGNOSIS — R922 Inconclusive mammogram: Secondary | ICD-10-CM | POA: Diagnosis not present

## 2021-01-06 DIAGNOSIS — I129 Hypertensive chronic kidney disease with stage 1 through stage 4 chronic kidney disease, or unspecified chronic kidney disease: Secondary | ICD-10-CM | POA: Diagnosis not present

## 2021-01-06 DIAGNOSIS — E1121 Type 2 diabetes mellitus with diabetic nephropathy: Secondary | ICD-10-CM | POA: Diagnosis not present

## 2021-01-06 DIAGNOSIS — N183 Chronic kidney disease, stage 3 unspecified: Secondary | ICD-10-CM | POA: Diagnosis not present

## 2021-01-06 DIAGNOSIS — E782 Mixed hyperlipidemia: Secondary | ICD-10-CM | POA: Diagnosis not present

## 2021-04-28 DIAGNOSIS — Z0000 Encounter for general adult medical examination without abnormal findings: Secondary | ICD-10-CM | POA: Diagnosis not present

## 2021-04-28 DIAGNOSIS — N183 Chronic kidney disease, stage 3 unspecified: Secondary | ICD-10-CM | POA: Diagnosis not present

## 2021-04-28 DIAGNOSIS — Z85528 Personal history of other malignant neoplasm of kidney: Secondary | ICD-10-CM | POA: Diagnosis not present

## 2021-04-28 DIAGNOSIS — I129 Hypertensive chronic kidney disease with stage 1 through stage 4 chronic kidney disease, or unspecified chronic kidney disease: Secondary | ICD-10-CM | POA: Diagnosis not present

## 2021-04-28 DIAGNOSIS — F3342 Major depressive disorder, recurrent, in full remission: Secondary | ICD-10-CM | POA: Diagnosis not present

## 2021-04-28 DIAGNOSIS — E782 Mixed hyperlipidemia: Secondary | ICD-10-CM | POA: Diagnosis not present

## 2021-04-28 DIAGNOSIS — E1121 Type 2 diabetes mellitus with diabetic nephropathy: Secondary | ICD-10-CM | POA: Diagnosis not present

## 2021-07-21 ENCOUNTER — Other Ambulatory Visit: Payer: Self-pay | Admitting: Family Medicine

## 2021-07-21 DIAGNOSIS — R921 Mammographic calcification found on diagnostic imaging of breast: Secondary | ICD-10-CM

## 2021-12-22 DIAGNOSIS — H5211 Myopia, right eye: Secondary | ICD-10-CM | POA: Diagnosis not present

## 2021-12-22 DIAGNOSIS — E119 Type 2 diabetes mellitus without complications: Secondary | ICD-10-CM | POA: Diagnosis not present

## 2021-12-22 DIAGNOSIS — Z961 Presence of intraocular lens: Secondary | ICD-10-CM | POA: Diagnosis not present

## 2022-01-26 DIAGNOSIS — E782 Mixed hyperlipidemia: Secondary | ICD-10-CM | POA: Diagnosis not present

## 2022-01-26 DIAGNOSIS — N183 Chronic kidney disease, stage 3 unspecified: Secondary | ICD-10-CM | POA: Diagnosis not present

## 2022-01-26 DIAGNOSIS — I129 Hypertensive chronic kidney disease with stage 1 through stage 4 chronic kidney disease, or unspecified chronic kidney disease: Secondary | ICD-10-CM | POA: Diagnosis not present

## 2022-01-26 DIAGNOSIS — E1121 Type 2 diabetes mellitus with diabetic nephropathy: Secondary | ICD-10-CM | POA: Diagnosis not present

## 2022-02-01 ENCOUNTER — Ambulatory Visit
Admission: RE | Admit: 2022-02-01 | Discharge: 2022-02-01 | Disposition: A | Discharge status: Home / Self Care | Payer: PPO | Source: Ambulatory Visit | Attending: Family Medicine | Admitting: Family Medicine

## 2022-02-01 DIAGNOSIS — R921 Mammographic calcification found on diagnostic imaging of breast: Secondary | ICD-10-CM

## 2022-05-06 DIAGNOSIS — E1121 Type 2 diabetes mellitus with diabetic nephropathy: Secondary | ICD-10-CM | POA: Diagnosis not present

## 2022-05-06 DIAGNOSIS — M722 Plantar fascial fibromatosis: Secondary | ICD-10-CM | POA: Diagnosis not present

## 2022-05-06 DIAGNOSIS — Z0000 Encounter for general adult medical examination without abnormal findings: Secondary | ICD-10-CM | POA: Diagnosis not present

## 2022-05-06 DIAGNOSIS — Z85528 Personal history of other malignant neoplasm of kidney: Secondary | ICD-10-CM | POA: Diagnosis not present

## 2022-05-06 DIAGNOSIS — R300 Dysuria: Secondary | ICD-10-CM | POA: Diagnosis not present

## 2022-05-06 DIAGNOSIS — E782 Mixed hyperlipidemia: Secondary | ICD-10-CM | POA: Diagnosis not present

## 2022-05-06 DIAGNOSIS — F3342 Major depressive disorder, recurrent, in full remission: Secondary | ICD-10-CM | POA: Diagnosis not present

## 2022-05-06 DIAGNOSIS — I129 Hypertensive chronic kidney disease with stage 1 through stage 4 chronic kidney disease, or unspecified chronic kidney disease: Secondary | ICD-10-CM | POA: Diagnosis not present

## 2022-05-06 DIAGNOSIS — N183 Chronic kidney disease, stage 3 unspecified: Secondary | ICD-10-CM | POA: Diagnosis not present

## 2022-08-15 DIAGNOSIS — E782 Mixed hyperlipidemia: Secondary | ICD-10-CM | POA: Diagnosis not present

## 2022-08-15 DIAGNOSIS — E1121 Type 2 diabetes mellitus with diabetic nephropathy: Secondary | ICD-10-CM | POA: Diagnosis not present

## 2022-08-15 DIAGNOSIS — N183 Chronic kidney disease, stage 3 unspecified: Secondary | ICD-10-CM | POA: Diagnosis not present

## 2022-08-15 DIAGNOSIS — Z794 Long term (current) use of insulin: Secondary | ICD-10-CM | POA: Diagnosis not present

## 2022-08-15 DIAGNOSIS — E1122 Type 2 diabetes mellitus with diabetic chronic kidney disease: Secondary | ICD-10-CM | POA: Diagnosis not present

## 2022-08-15 DIAGNOSIS — I129 Hypertensive chronic kidney disease with stage 1 through stage 4 chronic kidney disease, or unspecified chronic kidney disease: Secondary | ICD-10-CM | POA: Diagnosis not present

## 2022-11-14 ENCOUNTER — Other Ambulatory Visit: Payer: Self-pay | Admitting: Family Medicine

## 2022-11-14 DIAGNOSIS — Z1231 Encounter for screening mammogram for malignant neoplasm of breast: Secondary | ICD-10-CM

## 2022-12-16 DIAGNOSIS — U071 COVID-19: Secondary | ICD-10-CM | POA: Diagnosis not present

## 2022-12-16 DIAGNOSIS — R509 Fever, unspecified: Secondary | ICD-10-CM | POA: Diagnosis not present

## 2022-12-16 DIAGNOSIS — E1122 Type 2 diabetes mellitus with diabetic chronic kidney disease: Secondary | ICD-10-CM | POA: Diagnosis not present

## 2022-12-16 DIAGNOSIS — E1121 Type 2 diabetes mellitus with diabetic nephropathy: Secondary | ICD-10-CM | POA: Diagnosis not present

## 2022-12-16 DIAGNOSIS — N183 Chronic kidney disease, stage 3 unspecified: Secondary | ICD-10-CM | POA: Diagnosis not present

## 2022-12-16 DIAGNOSIS — Z794 Long term (current) use of insulin: Secondary | ICD-10-CM | POA: Diagnosis not present

## 2022-12-16 DIAGNOSIS — E782 Mixed hyperlipidemia: Secondary | ICD-10-CM | POA: Diagnosis not present

## 2022-12-16 DIAGNOSIS — I129 Hypertensive chronic kidney disease with stage 1 through stage 4 chronic kidney disease, or unspecified chronic kidney disease: Secondary | ICD-10-CM | POA: Diagnosis not present

## 2023-01-03 DIAGNOSIS — H5010 Unspecified exotropia: Secondary | ICD-10-CM | POA: Diagnosis not present

## 2023-01-03 DIAGNOSIS — E119 Type 2 diabetes mellitus without complications: Secondary | ICD-10-CM | POA: Diagnosis not present

## 2023-01-03 DIAGNOSIS — H52203 Unspecified astigmatism, bilateral: Secondary | ICD-10-CM | POA: Diagnosis not present

## 2023-01-03 DIAGNOSIS — Z961 Presence of intraocular lens: Secondary | ICD-10-CM | POA: Diagnosis not present

## 2023-02-17 ENCOUNTER — Ambulatory Visit
Admission: RE | Admit: 2023-02-17 | Discharge: 2023-02-17 | Disposition: A | Discharge status: Home / Self Care | Payer: PPO | Source: Ambulatory Visit | Attending: Family Medicine | Admitting: Family Medicine

## 2023-02-17 DIAGNOSIS — Z1231 Encounter for screening mammogram for malignant neoplasm of breast: Secondary | ICD-10-CM | POA: Diagnosis not present

## 2023-02-21 ENCOUNTER — Other Ambulatory Visit: Payer: Self-pay | Admitting: Family Medicine

## 2023-02-21 DIAGNOSIS — R928 Other abnormal and inconclusive findings on diagnostic imaging of breast: Secondary | ICD-10-CM

## 2023-05-17 ENCOUNTER — Other Ambulatory Visit: Payer: Self-pay | Admitting: Family Medicine

## 2023-05-17 DIAGNOSIS — E2839 Other primary ovarian failure: Secondary | ICD-10-CM

## 2023-10-30 ENCOUNTER — Other Ambulatory Visit: Payer: Self-pay

## 2023-10-30 ENCOUNTER — Emergency Department (HOSPITAL_COMMUNITY): Admission: EM | Admit: 2023-10-30 | Discharge: 2023-10-30 | Disposition: A | Discharge status: Home / Self Care

## 2023-10-30 ENCOUNTER — Encounter (HOSPITAL_COMMUNITY): Payer: Self-pay | Admitting: *Deleted

## 2023-10-30 ENCOUNTER — Emergency Department (HOSPITAL_COMMUNITY)

## 2023-10-30 DIAGNOSIS — N132 Hydronephrosis with renal and ureteral calculous obstruction: Secondary | ICD-10-CM | POA: Insufficient documentation

## 2023-10-30 DIAGNOSIS — E119 Type 2 diabetes mellitus without complications: Secondary | ICD-10-CM | POA: Insufficient documentation

## 2023-10-30 DIAGNOSIS — Z7982 Long term (current) use of aspirin: Secondary | ICD-10-CM | POA: Diagnosis not present

## 2023-10-30 DIAGNOSIS — N200 Calculus of kidney: Secondary | ICD-10-CM | POA: Diagnosis not present

## 2023-10-30 DIAGNOSIS — R7989 Other specified abnormal findings of blood chemistry: Secondary | ICD-10-CM | POA: Insufficient documentation

## 2023-10-30 DIAGNOSIS — K449 Diaphragmatic hernia without obstruction or gangrene: Secondary | ICD-10-CM | POA: Diagnosis not present

## 2023-10-30 DIAGNOSIS — I10 Essential (primary) hypertension: Secondary | ICD-10-CM | POA: Insufficient documentation

## 2023-10-30 DIAGNOSIS — Z85528 Personal history of other malignant neoplasm of kidney: Secondary | ICD-10-CM | POA: Insufficient documentation

## 2023-10-30 DIAGNOSIS — Z79899 Other long term (current) drug therapy: Secondary | ICD-10-CM | POA: Diagnosis not present

## 2023-10-30 DIAGNOSIS — Z7984 Long term (current) use of oral hypoglycemic drugs: Secondary | ICD-10-CM | POA: Insufficient documentation

## 2023-10-30 DIAGNOSIS — R109 Unspecified abdominal pain: Secondary | ICD-10-CM | POA: Diagnosis not present

## 2023-10-30 DIAGNOSIS — Z905 Acquired absence of kidney: Secondary | ICD-10-CM | POA: Diagnosis not present

## 2023-10-30 LAB — URINALYSIS, ROUTINE W REFLEX MICROSCOPIC
Bacteria, UA: NONE SEEN
Bilirubin Urine: NEGATIVE
Glucose, UA: NEGATIVE mg/dL
Ketones, ur: NEGATIVE mg/dL
Leukocytes,Ua: NEGATIVE
Nitrite: POSITIVE — AB
Protein, ur: NEGATIVE mg/dL
Specific Gravity, Urine: 1.012 (ref 1.005–1.030)
pH: 5 (ref 5.0–8.0)

## 2023-10-30 LAB — CBC WITH DIFFERENTIAL/PLATELET
Abs Immature Granulocytes: 0.03 10*3/uL (ref 0.00–0.07)
Basophils Absolute: 0 10*3/uL (ref 0.0–0.1)
Basophils Relative: 0 %
Eosinophils Absolute: 0.1 10*3/uL (ref 0.0–0.5)
Eosinophils Relative: 1 %
HCT: 41.7 % (ref 36.0–46.0)
Hemoglobin: 13.8 g/dL (ref 12.0–15.0)
Immature Granulocytes: 0 %
Lymphocytes Relative: 16 %
Lymphs Abs: 1.5 10*3/uL (ref 0.7–4.0)
MCH: 28.3 pg (ref 26.0–34.0)
MCHC: 33.1 g/dL (ref 30.0–36.0)
MCV: 85.6 fL (ref 80.0–100.0)
Monocytes Absolute: 0.6 10*3/uL (ref 0.1–1.0)
Monocytes Relative: 6 %
Neutro Abs: 7 10*3/uL (ref 1.7–7.7)
Neutrophils Relative %: 77 %
Platelets: 342 10*3/uL (ref 150–400)
RBC: 4.87 MIL/uL (ref 3.87–5.11)
RDW: 12.5 % (ref 11.5–15.5)
WBC: 9.2 10*3/uL (ref 4.0–10.5)
nRBC: 0 % (ref 0.0–0.2)

## 2023-10-30 LAB — BASIC METABOLIC PANEL WITH GFR
Anion gap: 12 (ref 5–15)
BUN: 19 mg/dL (ref 8–23)
CO2: 21 mmol/L — ABNORMAL LOW (ref 22–32)
Calcium: 9.3 mg/dL (ref 8.9–10.3)
Chloride: 102 mmol/L (ref 98–111)
Creatinine, Ser: 1.33 mg/dL — ABNORMAL HIGH (ref 0.44–1.00)
GFR, Estimated: 43 mL/min — ABNORMAL LOW (ref >60)
Glucose, Bld: 189 mg/dL — ABNORMAL HIGH (ref 70–99)
Potassium: 4.3 mmol/L (ref 3.5–5.1)
Sodium: 135 mmol/L (ref 135–145)

## 2023-10-30 MED ORDER — SODIUM CHLORIDE 0.9 % IV BOLUS
1000.0000 mL | Freq: Once | INTRAVENOUS | Status: AC
  Administered 2023-10-30: 1000 mL via INTRAVENOUS

## 2023-10-30 MED ORDER — TAMSULOSIN HCL 0.4 MG PO CAPS: 0.4000 mg | ORAL_CAPSULE | Freq: Every day | ORAL | 0 refills | Status: AC

## 2023-10-30 MED ORDER — ONDANSETRON 4 MG PO TBDP
4.0000 mg | ORAL_TABLET | Freq: Four times a day (QID) | ORAL | 0 refills | Status: AC | PRN
Start: 2023-10-30 — End: ?

## 2023-10-30 MED ORDER — OXYCODONE-ACETAMINOPHEN 5-325 MG PO TABS: 1.0000 | ORAL_TABLET | Freq: Four times a day (QID) | ORAL | 0 refills | Status: DC | PRN

## 2023-10-30 MED ORDER — ONDANSETRON HCL 4 MG/2ML IJ SOLN
4.0000 mg | Freq: Once | INTRAMUSCULAR | Status: AC
  Administered 2023-10-30: 4 mg via INTRAVENOUS
  Filled 2023-10-30: qty 2

## 2023-10-30 MED ORDER — HYDROMORPHONE HCL 1 MG/ML IJ SOLN
1.0000 mg | Freq: Once | INTRAMUSCULAR | Status: AC
  Administered 2023-10-30: 1 mg via INTRAVENOUS
  Filled 2023-10-30: qty 1

## 2023-10-30 NOTE — ED Provider Triage Note (Signed)
 Emergency Medicine Provider Triage Evaluation Note  Christina Cook , a 73 y.o. female  was evaluated in triage.  Pt complains of right flank pain started suddenly this morning around 730.  Associated with nausea vomiting.  No history of kidney stones.  Does have some urgency to pee.  No blood in the vomit.  Review of Systems  Positive: Nausea vomiting flank pain Negative: Negative for blood in vomit  Physical Exam  BP (!) 163/85 (BP Location: Right Arm)   Pulse (!) 56   Temp 97.9 F (36.6 C)   Resp 20   Ht 1.689 m (5' 6.5)   Wt 78 kg   SpO2 100%   BMI 27.35 kg/m  Gen:  Awake, uncomfortable Resp: Normal effort no acute respiratory distress. Heart regular rate and rhythm. Abdomen soft nontender no CVA tenderness MSK: Moves extremities without difficulty  Other:   Medical Decision Making  Medically screening exam initiated at 9:47 AM.  Appropriate orders placed.  Christina Cook was informed that the remainder of the evaluation will be completed by another provider, this initial triage assessment does not replace that evaluation, and the importance of remaining in the ED until their evaluation is complete.  Symptoms seem to be consistent with possible kidney stone with the right flank pain.  Will start IV will give some pain pain medication antinausea medicine will get CT renal.     Christina Robart, MD 10/30/23 [id number]

## 2023-10-30 NOTE — ED Triage Notes (Signed)
 Patient c/o right flank pain with vomiting  onset this am.

## 2023-10-30 NOTE — Discharge Instructions (Addendum)
You were seen in the emergency department and found to have a kidney stone.  Please take ibuprofen as needed for pain.  We are sending you home with multiple medications to assist with passing the stone and for residual pain/nausea:  -Flomax-this is a medication to help pass the stone, it allows urine to exit the body more freely.  Please take this once daily with a meal.  -Percocet-this is a narcotic/controlled substance medication that has potential addicting qualities.  We recommend that you take 1-2 tablets every 6 hours as needed for severe pain.  Do not drive or operate heavy machinery when taking this medicine as it can be sedating. Do not drink alcohol or take other sedating medications when taking this medicine for safety reasons.  Keep this out of reach of small children.  Please be aware this medicine has Tylenol in it (325 mg/tab) do not exceed the maximum dose of Tylenol in a day per over the counter recommendations should you decide to supplement with Tylenol over the counter.   -Zofran-this is an antinausea medication, you may take this every 8 hours as needed for nausea and vomiting, please allow the tablet to dissolve underneath of your tongue.   We have prescribed you new medication(s) today. Discuss the medications prescribed today with your pharmacist as they can have adverse effects and interactions with your other medicines including over the counter and prescribed medications. Seek medical evaluation if you start to experience new or abnormal symptoms after taking one of these medicines, seek care immediately if you start to experience difficulty breathing, feeling of your throat closing, facial swelling, or rash as these could be indications of a more serious allergic reaction  Please follow-up with the urology group provided in your discharge instructions within 3 to 5 days.  Return to the ER for new or worsening symptoms including but not limited to worsening pain not controlled  by these medicines, inability to keep fluids down, fever, or any other concerns that you may have.

## 2023-10-30 NOTE — ED Provider Notes (Signed)
 [facility] EMERGENCY DEPARTMENT AT [facility] Provider Note   CSN: [id number] Arrival date & time: 10/30/23  9073     Patient presents with: No chief complaint on file.   Christina Cook is a 73 y.o. female with past medical history seen for hypertension, hyperlipidemia, diabetes, renal cell carcinoma with history of left kidney removal who presents with concern for right sided flank pain, vomiting starting this morning.  No irritation with urination, no hematuria.  No fall or injury noted.  No numbness or tingling of legs.  Patient rates pain 9/10.   HPI     Prior to Admission medications   Medication Sig Start Date End Date Taking? Authorizing Provider  ondansetron  (ZOFRAN -ODT) 4 MG disintegrating tablet Take 1 tablet (4 mg total) by mouth every 6 (six) hours as needed for nausea or vomiting. 10/30/23  Yes Vonda Harth H, PA-C  oxyCODONE -acetaminophen  (PERCOCET/ROXICET) 5-325 MG tablet Take 1 tablet by mouth every 6 (six) hours as needed for severe pain (pain score 7-10). 10/30/23  Yes Teaira Croft H, PA-C  tamsulosin (FLOMAX) 0.4 MG CAPS capsule Take 1 capsule (0.4 mg total) by mouth daily. 10/30/23  Yes Jadea Shiffer H, PA-C  aspirin 81 MG tablet Take 81 mg by mouth daily.    [provider]  Calcium Carbonate-Vitamin D (CALCIUM 600 + D PO) Take 13 tablets by mouth daily.     [provider]  Cholecalciferol (VITAMIN D3) 1000 UNITS CAPS Take 1,000 Units by mouth daily.     [provider]  escitalopram  (LEXAPRO ) 10 MG tablet Take 10 mg by mouth daily.    [provider]  felodipine  (PLENDIL ) 10 MG 24 hr tablet Take 10 mg by mouth every morning.    [provider]  losartan -hydrochlorothiazide  (HYZAAR) 100-25 MG per tablet Take 1 tablet by mouth every morning.    [provider]  metFORMIN  (GLUCOPHAGE ) 1000 MG tablet Take 1,000 mg by mouth 2 (two) times daily with a meal.    [provider]   Multiple Vitamin (MULTIVITAMIN) tablet Take 1 tablet by mouth daily.    [provider]  ondansetron  (ZOFRAN ) 4 MG tablet Take 1 tablet (4 mg total) by mouth every 8 (eight) hours as needed for nausea or vomiting. 11/13/14   Jule Ronal CROME, PA-C  pyridoxine  (B-6) 100 MG tablet Take 100 mg by mouth daily.    [provider]  simvastatin  (ZOCOR ) 20 MG tablet Take 20 mg by mouth every evening.    [provider]    Allergies: Dramamine [dimenhydrinate] and Lisinopril    Review of Systems  All other systems reviewed and are negative.   Updated Vital Signs BP 128/67 (BP Location: Right Arm)   Pulse 60   Temp 98.6 F (37 C)   Resp 18   Ht 5' 6.5 (1.689 m)   Wt 78 kg   SpO2 100%   BMI 27.35 kg/m   Physical Exam Vitals and nursing note reviewed.  Constitutional:      General: She is not in acute distress.    Appearance: Normal appearance.  HENT:     Head: Normocephalic and atraumatic.   Eyes:     General:        Right eye: No discharge.        Left eye: No discharge.    Cardiovascular:     Rate and Rhythm: Normal rate and regular rhythm.     Heart sounds: No murmur heard.  No friction rub. No gallop.  Pulmonary:     Effort: Pulmonary effort is normal.     Breath sounds: Normal breath sounds.  Abdominal:     General: Bowel sounds are normal.     Palpations: Abdomen is soft.   Musculoskeletal:     Comments: Focal tenderness in the right lumbar paraspinous muscles/right flank.  Negative CVA tenderness.   Skin:    General: Skin is warm and dry.     Capillary Refill: Capillary refill takes less than 2 seconds.   Neurological:     Mental Status: She is alert and oriented to person, place, and time.   Psychiatric:        Mood and Affect: Mood normal.        Behavior: Behavior normal.     (all labs ordered are listed, but only abnormal results are displayed) Labs Reviewed  BASIC METABOLIC PANEL WITH GFR - Abnormal; Notable for the  following components:      Result Value   CO2 21 (*)    Glucose, Bld 189 (*)    Creatinine, Ser 1.33 (*)    GFR, Estimated 43 (*)    All other components within normal limits  URINALYSIS, ROUTINE W REFLEX MICROSCOPIC - Abnormal; Notable for the following components:   Color, Urine AMBER (*)    Hgb urine dipstick SMALL (*)    Nitrite POSITIVE (*)    All other components within normal limits  CBC WITH DIFFERENTIAL/PLATELET    EKG: None  Radiology: CT Renal Stone Study Result Date: 10/30/2023 CLINICAL DATA:  Right flank pain with nausea and vomiting. EXAM: CT ABDOMEN AND PELVIS WITHOUT CONTRAST TECHNIQUE: Multidetector CT imaging of the abdomen and pelvis was performed following the standard protocol without IV contrast. RADIATION DOSE REDUCTION: This exam was performed according to the departmental dose-optimization program which includes automated exposure control, adjustment of the mA and/or kV according to patient size and/or use of iterative reconstruction technique. COMPARISON:  11/17/2009. FINDINGS: Lower chest: Minimal dependent atelectasis bilaterally. Heart is at the upper limits of normal in size. No pericardial or pleural effusion. Distal esophagus is grossly unremarkable. Hepatobiliary: Liver is unremarkable. Cholecystectomy. No unexpected biliary ductal dilatation. Pancreas: Negative. Spleen: Negative. Adrenals/Urinary Tract: Right adrenal gland is unremarkable. Moderate right hydronephrosis secondary to a 2 mm stone at the right ureterovesical junction (3/66). Right kidney is otherwise unremarkable. Left adrenalectomy and left nephrectomy. Bladder is very low in volume. Stomach/Bowel: Tiny hiatal hernia. Stomach, small bowel and colon are unremarkable. Appendix is not readily visualized. Vascular/Lymphatic: Atherosclerotic calcification of the aorta. No pathologically enlarged lymph nodes. Reproductive: Hysterectomy.  No adnexal mass. Other: No free fluid.  Mesenteries and peritoneum  are unremarkable. Musculoskeletal: Degenerative changes in the spine. IMPRESSION: 1. Moderate right hydronephrosis secondary to a 2 mm stone at the right ureterovesical junction. 2.  Aortic atherosclerosis (ICD10-I70.0). Electronically Signed   By: Newell Eke M.D.   On: 10/30/2023 11:52     Procedures   Medications Ordered in the ED  HYDROmorphone  (DILAUDID ) injection 1 mg (1 mg Intravenous Given 10/30/23 1022)  ondansetron  (ZOFRAN ) injection 4 mg (4 mg Intravenous Given 10/30/23 1022)  sodium chloride  0.9 % bolus 1,000 mL (0 mLs Intravenous Stopped 10/30/23 1503)                                    Medical Decision Making  This patient is a 73 y.o. female  who presents to the ED for concern of flank pain.   Differential diagnoses prior to evaluation: The emergent differential diagnosis includes, but is not limited to,  AAA, renal vascular thrombosis, mesenteric ischemia, pyelonephritis, nephrolithiasis, cystitis, biliary colic, pancreatitis, PUD, appendicitis, diverticulitis, bowel obstruction . This is not an exhaustive differential.   Past Medical History / Co-morbidities / Social History: HLD, hx of renal cell carcinoma, lone kidney, diabetes  Physical Exam: Physical exam performed. The pertinent findings include: Focal right flank pain, negative CVA tenderness, otherwise unremarkable physical exam, initially hypertensive, blood pressure 163/85 on arrival, to 128/67 after pain control  Lab Tests/Imaging studies: I personally interpreted labs/imaging and the pertinent results include: CBC unremarkable, BMP notable for mild elevated creatinine 1.33, urine is pending at time of handoff CT shows obstructing right sided stone 2mm, single kidney noted. UA does show nitrates but patient took AZO prior to arrival, suspect not actually infected, no bacteria, no white blood cells noted.  I agree with the radiologist interpretation.  Medications: I ordered medication including dilaudid  for  pain, fluid bolus for inability to urinate on original arrival, zofran  for nausea.  I have reviewed the patients home medicines and have made adjustments as needed.   Disposition: After consideration of the diagnostic results and the patients response to treatment, I feel that patient stable for follow-up with urology with plan as above.   emergency department workup does not suggest an emergent condition requiring admission or immediate intervention beyond what has been performed at this time. The plan is: as above. The patient is safe for discharge and has been instructed to return immediately for worsening symptoms, change in symptoms or any other concerns.   Final diagnoses:  Nephrolithiasis    ED Discharge Orders          Ordered    oxyCODONE -acetaminophen  (PERCOCET/ROXICET) 5-325 MG tablet  Every 6 hours PRN        10/30/23 1538    tamsulosin (FLOMAX) 0.4 MG CAPS capsule  Daily        10/30/23 1538    ondansetron  (ZOFRAN -ODT) 4 MG disintegrating tablet  Every 6 hours PRN        10/30/23 1538               Jeniffer Culliver, [place] H, PA-C 10/30/23 1538    Neysa Caron PARAS, DO 10/30/23 1556

## 2023-10-31 DIAGNOSIS — N132 Hydronephrosis with renal and ureteral calculous obstruction: Secondary | ICD-10-CM | POA: Diagnosis not present

## 2023-10-31 DIAGNOSIS — Z905 Acquired absence of kidney: Secondary | ICD-10-CM | POA: Diagnosis not present

## 2023-10-31 DIAGNOSIS — N170 Acute kidney failure with tubular necrosis: Secondary | ICD-10-CM | POA: Diagnosis not present

## 2023-11-02 DIAGNOSIS — N170 Acute kidney failure with tubular necrosis: Secondary | ICD-10-CM | POA: Diagnosis not present

## 2023-11-10 DIAGNOSIS — E782 Mixed hyperlipidemia: Secondary | ICD-10-CM | POA: Diagnosis not present

## 2023-11-10 DIAGNOSIS — N183 Chronic kidney disease, stage 3 unspecified: Secondary | ICD-10-CM | POA: Diagnosis not present

## 2023-11-10 DIAGNOSIS — E1122 Type 2 diabetes mellitus with diabetic chronic kidney disease: Secondary | ICD-10-CM | POA: Diagnosis not present

## 2023-11-10 DIAGNOSIS — Z79899 Other long term (current) drug therapy: Secondary | ICD-10-CM | POA: Diagnosis not present

## 2023-11-10 DIAGNOSIS — I129 Hypertensive chronic kidney disease with stage 1 through stage 4 chronic kidney disease, or unspecified chronic kidney disease: Secondary | ICD-10-CM | POA: Diagnosis not present

## 2023-11-14 ENCOUNTER — Other Ambulatory Visit: Payer: Self-pay | Admitting: Urology

## 2023-11-14 DIAGNOSIS — N130 Hydronephrosis with ureteropelvic junction obstruction: Secondary | ICD-10-CM | POA: Diagnosis not present

## 2023-11-14 DIAGNOSIS — N132 Hydronephrosis with renal and ureteral calculous obstruction: Secondary | ICD-10-CM | POA: Diagnosis not present

## 2023-11-14 DIAGNOSIS — Z905 Acquired absence of kidney: Secondary | ICD-10-CM | POA: Diagnosis not present

## 2023-11-15 ENCOUNTER — Ambulatory Visit (HOSPITAL_COMMUNITY)

## 2023-11-15 ENCOUNTER — Encounter (HOSPITAL_COMMUNITY)
Admission: RE | Disposition: A | Discharge status: Home / Self Care | Payer: Self-pay | Source: Ambulatory Visit | Attending: Urology

## 2023-11-15 ENCOUNTER — Encounter (HOSPITAL_COMMUNITY): Payer: Self-pay | Admitting: Urology

## 2023-11-15 ENCOUNTER — Ambulatory Visit (HOSPITAL_COMMUNITY)
Admission: RE | Admit: 2023-11-15 | Discharge: 2023-11-15 | Disposition: A | Discharge status: Home / Self Care | Source: Ambulatory Visit | Attending: Urology | Admitting: Urology

## 2023-11-15 ENCOUNTER — Ambulatory Visit (HOSPITAL_COMMUNITY): Admitting: Anesthesiology

## 2023-11-15 ENCOUNTER — Other Ambulatory Visit: Payer: Self-pay

## 2023-11-15 DIAGNOSIS — Z7985 Long-term (current) use of injectable non-insulin antidiabetic drugs: Secondary | ICD-10-CM | POA: Insufficient documentation

## 2023-11-15 DIAGNOSIS — Z7984 Long term (current) use of oral hypoglycemic drugs: Secondary | ICD-10-CM | POA: Insufficient documentation

## 2023-11-15 DIAGNOSIS — N132 Hydronephrosis with renal and ureteral calculous obstruction: Secondary | ICD-10-CM | POA: Insufficient documentation

## 2023-11-15 DIAGNOSIS — Z85528 Personal history of other malignant neoplasm of kidney: Secondary | ICD-10-CM | POA: Diagnosis not present

## 2023-11-15 DIAGNOSIS — Z79899 Other long term (current) drug therapy: Secondary | ICD-10-CM | POA: Diagnosis not present

## 2023-11-15 DIAGNOSIS — F418 Other specified anxiety disorders: Secondary | ICD-10-CM | POA: Diagnosis not present

## 2023-11-15 DIAGNOSIS — I10 Essential (primary) hypertension: Secondary | ICD-10-CM | POA: Diagnosis not present

## 2023-11-15 DIAGNOSIS — E119 Type 2 diabetes mellitus without complications: Secondary | ICD-10-CM | POA: Diagnosis not present

## 2023-11-15 DIAGNOSIS — N201 Calculus of ureter: Secondary | ICD-10-CM

## 2023-11-15 DIAGNOSIS — Z794 Long term (current) use of insulin: Secondary | ICD-10-CM | POA: Diagnosis not present

## 2023-11-15 DIAGNOSIS — Z905 Acquired absence of kidney: Secondary | ICD-10-CM | POA: Insufficient documentation

## 2023-11-15 DIAGNOSIS — N2889 Other specified disorders of kidney and ureter: Secondary | ICD-10-CM | POA: Diagnosis not present

## 2023-11-15 HISTORY — PX: CYSTOSCOPY/URETEROSCOPY/HOLMIUM LASER/STENT PLACEMENT: SHX6546

## 2023-11-15 LAB — GLUCOSE, CAPILLARY
Glucose-Capillary: 93 mg/dL (ref 70–99)
Glucose-Capillary: 84 mg/dL (ref 70–99)

## 2023-11-15 SURGERY — CYSTOSCOPY/URETEROSCOPY/HOLMIUM LASER/STENT PLACEMENT
Anesthesia: General | Laterality: Right

## 2023-11-15 MED ORDER — ORAL CARE MOUTH RINSE: 15.0000 mL | Freq: Once | OROMUCOSAL | Status: AC

## 2023-11-15 MED ORDER — PHENAZOPYRIDINE HCL 200 MG PO TABS: 200.0000 mg | ORAL_TABLET | Freq: Three times a day (TID) | ORAL | 0 refills | Status: AC | PRN

## 2023-11-15 MED ORDER — DEXAMETHASONE SODIUM PHOSPHATE 10 MG/ML IJ SOLN
INTRAMUSCULAR | Status: AC
  Filled 2023-11-15: qty 1

## 2023-11-15 MED ORDER — FENTANYL CITRATE (PF) 100 MCG/2ML IJ SOLN
INTRAMUSCULAR | Status: AC
  Filled 2023-11-15: qty 2

## 2023-11-15 MED ORDER — OXYCODONE HCL 5 MG/5ML PO SOLN: 5.0000 mg | Freq: Once | ORAL | Status: AC | PRN

## 2023-11-15 MED ORDER — IOHEXOL 300 MG/ML  SOLN
INTRAMUSCULAR | Status: DC | PRN
  Administered 2023-11-15: 9 mL

## 2023-11-15 MED ORDER — OXYCODONE HCL 5 MG PO TABS
ORAL_TABLET | ORAL | Status: AC
  Filled 2023-11-15: qty 1

## 2023-11-15 MED ORDER — EPHEDRINE 5 MG/ML INJ
INTRAVENOUS | Status: AC
  Filled 2023-11-15: qty 5

## 2023-11-15 MED ORDER — INSULIN ASPART 100 UNIT/ML IJ SOLN: 0.0000 [IU] | INTRAMUSCULAR | Status: DC | PRN

## 2023-11-15 MED ORDER — OXYBUTYNIN CHLORIDE 5 MG PO TABS: 5.0000 mg | ORAL_TABLET | Freq: Three times a day (TID) | ORAL | 1 refills | Status: AC | PRN

## 2023-11-15 MED ORDER — CHLORHEXIDINE GLUCONATE 0.12 % MT SOLN
15.0000 mL | Freq: Once | OROMUCOSAL | Status: AC
  Administered 2023-11-15: 15 mL via OROMUCOSAL

## 2023-11-15 MED ORDER — ACETAMINOPHEN 500 MG PO TABS
ORAL_TABLET | ORAL | Status: AC
  Filled 2023-11-15: qty 2

## 2023-11-15 MED ORDER — CEFAZOLIN SODIUM-DEXTROSE 2-4 GM/100ML-% IV SOLN
2.0000 g | INTRAVENOUS | Status: AC
  Administered 2023-11-15: 2 g via INTRAVENOUS
  Filled 2023-11-15: qty 100

## 2023-11-15 MED ORDER — ONDANSETRON HCL 4 MG/2ML IJ SOLN
INTRAMUSCULAR | Status: DC | PRN
  Administered 2023-11-15: 4 mg via INTRAVENOUS

## 2023-11-15 MED ORDER — ONDANSETRON HCL 4 MG/2ML IJ SOLN
INTRAMUSCULAR | Status: AC
Start: 2023-11-15 — End: 2023-11-15
  Filled 2023-11-15: qty 2

## 2023-11-15 MED ORDER — FENTANYL CITRATE (PF) 100 MCG/2ML IJ SOLN
INTRAMUSCULAR | Status: DC | PRN
  Administered 2023-11-15: 50 ug via INTRAVENOUS
  Administered 2023-11-15 (×2): 25 ug via INTRAVENOUS

## 2023-11-15 MED ORDER — LACTATED RINGERS IV SOLN: INTRAVENOUS | Status: DC

## 2023-11-15 MED ORDER — TRAMADOL HCL 50 MG PO TABS: 50.0000 mg | ORAL_TABLET | Freq: Four times a day (QID) | ORAL | 0 refills | Status: AC | PRN

## 2023-11-15 MED ORDER — PROPOFOL 10 MG/ML IV BOLUS
INTRAVENOUS | Status: DC | PRN
  Administered 2023-11-15: 100 mg via INTRAVENOUS

## 2023-11-15 MED ORDER — DEXAMETHASONE SODIUM PHOSPHATE 4 MG/ML IJ SOLN
INTRAMUSCULAR | Status: DC | PRN
  Administered 2023-11-15: 5 mg via INTRAVENOUS

## 2023-11-15 MED ORDER — LIDOCAINE HCL (PF) 2 % IJ SOLN
INTRAMUSCULAR | Status: AC
  Filled 2023-11-15: qty 5

## 2023-11-15 MED ORDER — ACETAMINOPHEN 500 MG PO TABS
1000.0000 mg | ORAL_TABLET | Freq: Once | ORAL | Status: AC
  Administered 2023-11-15: 1000 mg via ORAL
  Filled 2023-11-15: qty 2

## 2023-11-15 MED ORDER — AMISULPRIDE (ANTIEMETIC) 5 MG/2ML IV SOLN: 10.0000 mg | Freq: Once | INTRAVENOUS | Status: DC | PRN

## 2023-11-15 MED ORDER — PHENYLEPHRINE 80 MCG/ML (10ML) SYRINGE FOR IV PUSH (FOR BLOOD PRESSURE SUPPORT)
PREFILLED_SYRINGE | INTRAVENOUS | Status: AC
  Filled 2023-11-15: qty 10

## 2023-11-15 MED ORDER — LIDOCAINE HCL (CARDIAC) PF 100 MG/5ML IV SOSY
PREFILLED_SYRINGE | INTRAVENOUS | Status: DC | PRN
  Administered 2023-11-15: 60 mg via INTRAVENOUS

## 2023-11-15 MED ORDER — OXYCODONE HCL 5 MG PO TABS
5.0000 mg | ORAL_TABLET | Freq: Once | ORAL | Status: AC | PRN
  Administered 2023-11-15: 5 mg via ORAL

## 2023-11-15 MED ORDER — FENTANYL CITRATE PF 50 MCG/ML IJ SOSY: 25.0000 ug | PREFILLED_SYRINGE | INTRAMUSCULAR | Status: DC | PRN

## 2023-11-15 MED ORDER — PHENYLEPHRINE HCL (PRESSORS) 10 MG/ML IV SOLN
INTRAVENOUS | Status: DC | PRN
  Administered 2023-11-15: 80 ug via INTRAVENOUS

## 2023-11-15 MED ORDER — PROPOFOL 10 MG/ML IV BOLUS
INTRAVENOUS | Status: AC
  Filled 2023-11-15: qty 20

## 2023-11-15 SURGICAL SUPPLY — 18 items
BAG URO CATCHER STRL LF (MISCELLANEOUS) ×1 IMPLANT
BASKET ZERO TIP NITINOL 2.4FR (BASKET) IMPLANT
CATH URETL OPEN 5X70 (CATHETERS) ×1 IMPLANT
CLOTH BEACON ORANGE TIMEOUT ST (SAFETY) ×1 IMPLANT
EXTRACTOR STONE NITINOL NGAGE (UROLOGICAL SUPPLIES) IMPLANT
FIBER LASER MOSES 200 DFL (Laser) IMPLANT
GLOVE SURG LX STRL 8.0 MICRO (GLOVE) ×1 IMPLANT
GOWN STRL SURGICAL XL XLNG (GOWN DISPOSABLE) ×1 IMPLANT
GUIDEWIRE STR DUAL SENSOR (WIRE) IMPLANT
GUIDEWIRE ZIPWRE .038 STRAIGHT (WIRE) ×1 IMPLANT
KIT TURNOVER KIT A (KITS) ×1 IMPLANT
MANIFOLD NEPTUNE II (INSTRUMENTS) ×1 IMPLANT
PACK CYSTO (CUSTOM PROCEDURE TRAY) ×1 IMPLANT
SHEATH NAVIGATOR HD 11/13X36 (SHEATH) IMPLANT
STENT URET 6FRX24 CONTOUR (STENTS) IMPLANT
STENT URET 6FRX26 CONTOUR (STENTS) IMPLANT
TUBING CONNECTING 10 (TUBING) ×1 IMPLANT
TUBING UROLOGY SET (TUBING) ×1 IMPLANT

## 2023-11-15 NOTE — Op Note (Signed)
 Operative Note  Preoperative diagnosis:  1.  2 mm right distal ureteral stone 2.  Solitary right kidney  Postoperative diagnosis: 1.  No right ureteral stone 2.  Solitary right kidney  Procedure(s): 1.  Cystoscopy with right ureteroscopy and right ureteral stent placement 2.  Right retrograde pyelogram with intraoperative interpretation of fluoroscopic imaging  Surgeon: Lonni Han, MD  Assistants:  None  Anesthesia:  General  Complications:  None  EBL: Less than 5 mL  Specimens: 1.  None  Drains/Catheters: 1.  Right 6 French, 24 cm JJ stent without tether  Intraoperative findings:   Solitary right collecting system with no filling defects or dilation involving the right ureter or right renal pelvis seen on retrograde pyelogram There is no evidence of any stone burden within the right ureter or right renal pelvis.  There was a small amount of mucosal edema and excoriation involving the distal aspects of the right ureter, suggestive of a recently passed stone  Indication:  Christina Cook is a 73 y.o. 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  She was recently found to have an obstructing 2 mm right distal ureteral stone and ongoing renal colic.  She has been consented for the above procedures, voices understanding and wishes to proceed.  Description of procedure:  After informed consent was obtained, the patient was brought to the operating room and general LMA anesthesia was administered. The patient was then placed in the dorsolithotomy position and prepped and draped in the usual sterile fashion. A timeout was performed. A 21 French rigid cystoscope was then inserted into the urethral meatus and advanced into the bladder under direct vision. A complete bladder survey revealed no intravesical pathology.  A 5 French ureteral catheter was then inserted into the right ureteral orifice and a retrograde pyelogram was obtained, with  the findings listed above.  A Glidewire was then used to intubate the lumen of the ureteral catheter and was advanced up to the right renal pelvis, under fluoroscopic guidance.  The catheter was then removed, leaving the wire in place.  A semirigid ureteroscope was then placed in the right ureter and advanced up to the right UPJ, identifying no luminal stone burden.  The semirigid ureteroscope was then removed.  A flexible ureteroscope was then advanced alongside the wire and up to the right renal pelvis.  Full inspection of the renal pelvis and all associated calyces revealed no stone burden.  The distal aspect of the right ureter did demonstrate mucosal edema and excoriation, suggestive of a recently passed stone.  The flexible ureteroscope was then removed, leaving the wire in place.  A 6 French, 24 cm JJ stent was then placed over the wire and into good position within the right collecting system, confirming placement via fluoroscopy.  The patient's bladder was drained.  She tolerated the procedure well and was transferred to the postanesthesia in stable condition.  Plan: Follow-up in 1 week for office cystoscopy and stent removal

## 2023-11-15 NOTE — Anesthesia Preprocedure Evaluation (Addendum)
 Anesthesia Evaluation  Patient identified by MRN, date of birth, ID band Patient awake    Reviewed: Allergy & Precautions, NPO status , Patient's Chart, lab work & pertinent test results  Airway Mallampati: II  TM Distance: >3 FB Neck ROM: Full    Dental no notable dental hx. (+) Teeth Intact, Dental Advisory Given   Pulmonary neg pulmonary ROS   Pulmonary exam normal breath sounds clear to auscultation       Cardiovascular hypertension, Normal cardiovascular exam Rhythm:Regular Rate:Normal     Neuro/Psych  PSYCHIATRIC DISORDERS Anxiety Depression    negative neurological ROS     GI/Hepatic negative GI ROS, Neg liver ROS,,,  Endo/Other  diabetes, Type 2, Insulin  Dependent, Oral Hypoglycemic Agents    Renal/GU negative Renal ROS  negative genitourinary   Musculoskeletal  (+) Arthritis ,    Abdominal   Peds  Hematology negative hematology ROS (+)   Anesthesia Other Findings   Reproductive/Obstetrics                              Anesthesia Physical Anesthesia Plan  ASA: 3  Anesthesia Plan: General   Post-op Pain Management: Tylenol  PO (pre-op)*   Induction: Intravenous  PONV Risk Score and Plan: 3 and Ondansetron  and Treatment may vary due to age or medical condition  Airway Management Planned: LMA  Additional Equipment:   Intra-op Plan:   Post-operative Plan: Extubation in OR  Informed Consent: I have reviewed the patients History and Physical, chart, labs and discussed the procedure including the risks, benefits and alternatives for the proposed anesthesia with the patient or authorized representative who has indicated his/her understanding and acceptance.     Dental advisory given  Plan Discussed with: CRNA  Anesthesia Plan Comments:          Anesthesia Quick Evaluation

## 2023-11-15 NOTE — Anesthesia Procedure Notes (Signed)
 Procedure Name: LMA Insertion Date/Time: 11/15/2023 2:09 PM  Performed by: Buster Catheryn SAUNDERS, CRNAPre-anesthesia Checklist: Patient identified, Emergency Drugs available, Suction available and Patient being monitored Patient Re-evaluated:Patient Re-evaluated prior to induction Oxygen Delivery Method: Circle system utilized Preoxygenation: Pre-oxygenation with 100% oxygen Induction Type: IV induction Ventilation: Mask ventilation without difficulty LMA: LMA inserted LMA Size: 4.0 Number of attempts: 1 Placement Confirmation: positive ETCO2 Tube secured with: Tape Dental Injury: Teeth and Oropharynx as per pre-operative assessment

## 2023-11-15 NOTE — H&P (Signed)
 Office Visit Report     11/14/2023   --------------------------------------------------------------------------------   Christina Cook  MRN: [id number]  DOB: 09/25/1950, 73 year old Female   PRIMARY CARE:  Kimberlee D. Loreli, MD  PRIMARY CARE FAX:  [phone]  REFERRING:  Joen BIRCH. Loreli, MD  PROVIDER:  Norleen Seltzer, M.D.  TREATING:  Sherlyn Moats, NP  LOCATION:  Client - [facility] [phone]     --------------------------------------------------------------------------------   CC/HPI: 10/31/23: Christina Cook is a 73 yo female former patient who had a left HAL nephrectomy in 2009. She returns today after being see in the ER on 6/30 for right flank pain. She was found to have a 1-65mm RUJ stone with mild hydronephrosis. Her Cr was 1.33 with a GFR or 43 and her UA had 6-10 RBC's. She is pain free since the pain med. She has not had a stone pass. Her UA is clear. She has had no prior stones.   11/14/2023: 73 year old female with a solitary right kidney who presents today for follow-up regarding a distal right UVJ stone measuring 1 to 2 mm. She has persistent right-sided hydronephrosis but her creatinine has greatly improved. She continues to have occasional discomfort but describes it as not severe. She is having some dizziness and nausea from tamsulosin . Urine is clear today.     ALLERGIES: Dramamine TABS Lisinopril    MEDICATIONS: metFORMIN  HCl 500 MG Tablet  Simvastatin  20 MG Tablet  Tamsulosin  HCl 0.4 MG Capsule  Aspirin 81  Endocet 5-325 MG Tablet  Multi Vitamin Tablet Oral  NovoLOG   Ondansetron  HCl 4 MG Tablet  Ozempic (1 MG/DOSE)  Simvastatin  20 MG Tablet Oral  Tresiba  Vitamin B12  Vitamin B-6 100 MG Tablet 1 Oral Daily  Vitamin D3 25 MCG (1000 UT) Capsule 0 Oral     GU PSH: Hysterectomy Unilat SO - 2008 Simple Nephrectomy - 2009       PSH Notes: Rotator Cuff Repair, Nephrectomy Left, Hysterectomy, Gallbladder Surgery, Breast Surgery Lumpectomy, Tubal  Ligation, Tonsillectomy, ic, eye surgery, torn retina   NON-GU PSH: Breast lumpectomy, Right Cataract Surgery.. Cholecystectomy (laparoscopic) Colonoscopy Knee replacement Remove Breast Lesion - 2008 Remove Tonsils - 2008 Rotator cuff surgery Tubal Ligation - 2008     GU PMH: Acute kidney failure, Cr was up to 1.33 in the ER from about 1.1 at baseline. I will monitor closely. - 10/31/2023 Ureteral calculus - 10/31/2023 Ureteral obstruction secondary to calculous, She has some tenderness on exam and mild residual hydro on RUS so I don't think the stone has passed yet. She is making urine so she doesn't have complete obstruction. I will get a BMP on 7/3 and have her return in a week for a repeat US . She will continue tamsulosin  and knows to call if her symptoms worsen or if she stops making urine. - 10/31/2023 Acute Cystitis/UTI, Acute Cystitis - 2014 Candidiasis of vulva and vagina, Vaginal Candidiasis - 2014 Nocturia, Nocturia - 2014 Oth GU systems Signs/Symptoms, Bladder Pain - 2014 Postmenopausal atrophic vaginitis, Atrophic vaginitis - 2014 Urinary Frequency, Increased urinary frequency - 2014      PMH Notes:  1898-05-02 00:00:00 - Note: Normal Routine History And Physical Adult  2010-05-31 16:13:49 - Note: Renal Cell Carcinoma   NON-GU PMH: Acquired absence of kidney - 10/31/2023 Anxiety, Anxiety - 2014 Personal history of other diseases of the circulatory system, History of hypertension - 2014 Personal history of other endocrine, nutritional and metabolic disease, History of diabetes mellitus - 2014, History of  hyperglycemia, - 2014, History of hypercholesterolemia, - 2014 Personal history of other specified conditions, History of chest pain - 2014 Diabetes Type 2    FAMILY HISTORY: Death In The Family Father - Other Death In The Family Mother - Other Family Health Status Number - Runs In Family   SOCIAL HISTORY: Marital Status: Married Preferred Language: English; Ethnicity:  Not Hispanic Or Latino; Race: White Current Smoking Status: Patient has never smoked.   Tobacco Use Assessment Completed: Used Tobacco in last 30 days? Does not use smokeless tobacco. Has never drank.  Does not use drugs. Drinks 1 caffeinated drink per day.     Notes: Daily Coffee Consumption (___ Cups/Day), Daily Tea Consumption (___ Cups/Day), Occupation:, Alcohol Use, Marital History - Currently Married, Tobacco Use   REVIEW OF SYSTEMS:    GU Review Female:   Patient reports frequent urination and hard to postpone urination. Patient denies trouble starting your stream, have to strain to urinate, leakage of urine, burning /pain with urination, get up at night to urinate, being pregnant, and stream starts and stops.  Gastrointestinal (Upper):   Patient reports nausea. Patient denies vomiting and indigestion/ heartburn.  Gastrointestinal (Lower):   Patient denies diarrhea and constipation.  Constitutional:   Patient denies fever, night sweats, weight loss, and fatigue.  Skin:   Patient denies skin rash/ lesion and itching.  Eyes:   Patient denies blurred vision and double vision.  Ears/ Nose/ Throat:   Patient denies sore throat and sinus problems.  Hematologic/Lymphatic:   Patient denies swollen glands and easy bruising.  Cardiovascular:   Patient denies leg swelling and chest pains.  Respiratory:   Patient denies cough and shortness of breath.  Musculoskeletal:   Patient denies back pain and joint pain.  Neurological:   Patient denies headaches and dizziness.  Psychologic:   Patient denies depression and anxiety.   VITAL SIGNS:      11/14/2023 02:56 PM  BP 152/87 mmHg  Pulse 78 /min  Temperature 96.8 F / 36 C   MULTI-SYSTEM PHYSICAL EXAMINATION:    Constitutional: Well-nourished. No physical deformities. Normally developed. Good grooming.  Cardiovascular: Normal temperature, normal extremity pulses, no swelling, no varicosities.  Skin: No paleness, no jaundice, no cyanosis. No  lesion, no ulcer, no rash.  Neurologic / Psychiatric: Oriented to time, oriented to place, oriented to person. No depression, no anxiety, no agitation.  Gastrointestinal: No mass, no tenderness, no rigidity, non obese abdomen.     Complexity of Data:  Source Of History:  Patient  Records Review:   Previous Doctor Records, Previous Hospital Records, Previous Patient Records  Urine Test Review:   Urinalysis  X-Ray Review: Renal Ultrasound (Limited): Reviewed Films. Reviewed Report. Discussed With Patient.  C.T. Stone Protocol: Reviewed Films. Reviewed Report. Discussed With Patient.     11/14/23  Urinalysis  Urine Appearance Clear   Urine Color Yellow   Urine Glucose Neg mg/dL  Urine Bilirubin Neg mg/dL  Urine Ketones Neg mg/dL  Urine Specific Gravity 1.020   Urine Blood Neg ery/uL  Urine pH <=5.0   Urine Protein Neg mg/dL  Urine Urobilinogen 0.2 mg/dL  Urine Nitrites Neg   Urine Leukocyte Esterase Neg leu/uL   PROCEDURES:         Renal Ultrasound (Limited) - 23224  Kidney:Right Length: 12.5cm Depth: 5.3 cm Cortical Width: 1.4 cm Width: 5.6 cm    Right Kidney/Ureter:  hydro is still present on today's scan  Bladder:  PVR= 139.2ml      . Patient  confirmed No Neulasta OnPro Device.           Visit Complexity - G2211          Urinalysis Dipstick Dipstick Cont'd  Color: Yellow Bilirubin: Neg mg/dL  Appearance: Clear Ketones: Neg mg/dL  Specific Gravity: 8.979 Blood: Neg ery/uL  pH: <=5.0 Protein: Neg mg/dL  Glucose: Neg mg/dL Urobilinogen: 0.2 mg/dL    Nitrites: Neg    Leukocyte Esterase: Neg leu/uL    ASSESSMENT:      ICD-10 Details  1 GU:   Ureteral obstruction secondary to calculous - N13.2 Right, Acute, Systemic Symptoms  3   Hydronephrosis - N13.0 Right, Acute, Systemic Symptoms  2 NON-GU:   Acquired absence of kidney - Z90.5 Chronic, Stable   PLAN:           Orders Labs CULTURE, URINE          Document Letter(s):  Created for Patient: Clinical  Summary         Notes:   Persistent right-sided hydronephrosis on ultrasound today. Her urine is clear and most recent creatinine has normalized to 0.8. Discussed Ureteroscopy in detail with her. Urinalysis will be sent for precautionary culture today. Stone intervention was discussed in detail today. For ureteroscopy, the patient understands that there is a chance for a staged procedure. Patient also understands that there is risk for bleeding, infection, injury to surrounding organs, and general risks of anesthesia. The patient also understands the placement of a stent and the risks of stent placement including, risk for infection, the risk for pain, and the risk for injury.   The risks, benefits and alternatives of cystoscopy with RIGHT ureteroscopy, laser lithotripsy and ureteral stent placement was discussed the patient.  Risks included, but are not limited to: bleeding, urinary tract infection, ureteral injury/avulsion, ureteral stricture formation, retained stone fragments, the possibility that multiple surgeries may be required to treat the stone(s), MI, stroke, PE and the inherent risks of general anesthesia.  The patient voices understanding and wishes to proceed.

## 2023-11-15 NOTE — Transfer of Care (Signed)
 Immediate Anesthesia Transfer of Care Note  Patient: Christina Cook  Procedure(s) Performed: CYSTOSCOPY/URETEROSCOPY/STENT PLACEMENT (Right)  Patient Location: PACU  Anesthesia Type:General  Level of Consciousness: awake, alert , and oriented  Airway & Oxygen Therapy: Patient Spontanous Breathing and Patient connected to face mask oxygen  Post-op Assessment: Report given to RN and Post -op Vital signs reviewed and stable  Post vital signs: Reviewed and stable  Last Vitals:  Vitals Value Taken Time  BP 130/82 11/15/23 14:43  Temp    Pulse 74 11/15/23 14:45  Resp 18 11/15/23 14:45  SpO2 100 % 11/15/23 14:45  Vitals shown include unfiled device data.  Last Pain:  Vitals:   11/15/23 1055  TempSrc: Oral         Complications: No notable events documented.

## 2023-11-16 ENCOUNTER — Encounter (HOSPITAL_COMMUNITY): Payer: Self-pay | Admitting: Urology

## 2023-11-16 NOTE — Anesthesia Postprocedure Evaluation (Signed)
 Anesthesia Post Note  Patient: Christina Cook  Procedure(s) Performed: CYSTOSCOPY/URETEROSCOPY/STENT PLACEMENT (Right)     Patient location during evaluation: PACU Anesthesia Type: General Level of consciousness: awake and alert Pain management: pain level controlled Vital Signs Assessment: post-procedure vital signs reviewed and stable Respiratory status: spontaneous breathing, nonlabored ventilation, respiratory function stable and patient connected to nasal cannula oxygen Cardiovascular status: blood pressure returned to baseline and stable Postop Assessment: no apparent nausea or vomiting Anesthetic complications: no   No notable events documented.  Last Vitals:  Vitals:   11/15/23 1515 11/15/23 1530  BP: (!) 165/89 (!) 165/101  Pulse: 73 73  Resp: 13 16  Temp: (!) 36.1 C 36.6 C  SpO2: 99% 99%    Last Pain:  Vitals:   11/15/23 1630  TempSrc:   PainSc: 1                  Shambhavi Salley L Deziya Amero

## 2023-11-22 DIAGNOSIS — N201 Calculus of ureter: Secondary | ICD-10-CM | POA: Diagnosis not present

## 2023-11-22 DIAGNOSIS — N130 Hydronephrosis with ureteropelvic junction obstruction: Secondary | ICD-10-CM | POA: Diagnosis not present

## 2023-11-22 DIAGNOSIS — R8271 Bacteriuria: Secondary | ICD-10-CM | POA: Diagnosis not present

## 2024-01-04 DIAGNOSIS — E119 Type 2 diabetes mellitus without complications: Secondary | ICD-10-CM | POA: Diagnosis not present

## 2024-01-04 DIAGNOSIS — H5213 Myopia, bilateral: Secondary | ICD-10-CM | POA: Diagnosis not present

## 2024-01-04 DIAGNOSIS — Z961 Presence of intraocular lens: Secondary | ICD-10-CM | POA: Diagnosis not present

## 2024-01-04 DIAGNOSIS — H5010 Unspecified exotropia: Secondary | ICD-10-CM | POA: Diagnosis not present

## 2024-01-05 DIAGNOSIS — D4101 Neoplasm of uncertain behavior of right kidney: Secondary | ICD-10-CM | POA: Diagnosis not present

## 2024-01-05 DIAGNOSIS — N201 Calculus of ureter: Secondary | ICD-10-CM | POA: Diagnosis not present

## 2024-01-05 DIAGNOSIS — Z85528 Personal history of other malignant neoplasm of kidney: Secondary | ICD-10-CM | POA: Diagnosis not present

## 2024-01-08 ENCOUNTER — Other Ambulatory Visit: Payer: Self-pay | Admitting: Nurse Practitioner

## 2024-01-08 DIAGNOSIS — D4101 Neoplasm of uncertain behavior of right kidney: Secondary | ICD-10-CM

## 2024-01-19 ENCOUNTER — Other Ambulatory Visit: Payer: PPO

## 2024-02-03 ENCOUNTER — Ambulatory Visit
Admission: RE | Admit: 2024-02-03 | Discharge: 2024-02-03 | Disposition: A | Discharge status: Home / Self Care | Source: Ambulatory Visit | Attending: Nurse Practitioner | Admitting: Nurse Practitioner

## 2024-02-03 DIAGNOSIS — K5730 Diverticulosis of large intestine without perforation or abscess without bleeding: Secondary | ICD-10-CM | POA: Diagnosis not present

## 2024-02-03 DIAGNOSIS — N281 Cyst of kidney, acquired: Secondary | ICD-10-CM | POA: Diagnosis not present

## 2024-02-03 DIAGNOSIS — D4101 Neoplasm of uncertain behavior of right kidney: Secondary | ICD-10-CM

## 2024-02-03 MED ORDER — GADOPICLENOL 0.5 MMOL/ML IV SOLN
8.0000 mL | Freq: Once | INTRAVENOUS | Status: AC | PRN
  Administered 2024-02-03: 8 mL via INTRAVENOUS

## 2024-02-19 ENCOUNTER — Encounter

## 2024-02-19 ENCOUNTER — Ambulatory Visit
Admission: RE | Admit: 2024-02-19 | Discharge: 2024-02-19 | Disposition: A | Source: Ambulatory Visit | Attending: Family Medicine | Admitting: Family Medicine

## 2024-02-19 ENCOUNTER — Other Ambulatory Visit

## 2024-02-19 ENCOUNTER — Ambulatory Visit: Admission: RE | Admit: 2024-02-19 | Source: Ambulatory Visit

## 2024-02-19 DIAGNOSIS — R928 Other abnormal and inconclusive findings on diagnostic imaging of breast: Secondary | ICD-10-CM

## 2024-02-29 DIAGNOSIS — K869 Disease of pancreas, unspecified: Secondary | ICD-10-CM | POA: Diagnosis not present

## 2024-03-20 ENCOUNTER — Other Ambulatory Visit: Payer: Self-pay | Admitting: Gastroenterology

## 2024-04-11 ENCOUNTER — Encounter (HOSPITAL_COMMUNITY): Payer: Self-pay | Admitting: Gastroenterology

## 2024-04-11 NOTE — Progress Notes (Signed)
 Attempted to obtain medical history for pre op call via telephone, unable to reach at this time. HIPAA compliant voicemail message left requesting return call to pre surgical testing department.

## 2024-04-17 ENCOUNTER — Other Ambulatory Visit: Payer: Self-pay

## 2024-04-17 ENCOUNTER — Ambulatory Visit (HOSPITAL_COMMUNITY): Admitting: Anesthesiology

## 2024-04-17 ENCOUNTER — Encounter (HOSPITAL_COMMUNITY): Payer: Self-pay | Admitting: Gastroenterology

## 2024-04-17 ENCOUNTER — Encounter (HOSPITAL_COMMUNITY): Admission: RE | Disposition: A | Payer: Self-pay | Attending: Gastroenterology

## 2024-04-17 ENCOUNTER — Ambulatory Visit (HOSPITAL_COMMUNITY)
Admission: RE | Admit: 2024-04-17 | Discharge: 2024-04-17 | Disposition: A | Attending: Gastroenterology | Admitting: Gastroenterology

## 2024-04-17 DIAGNOSIS — Z7984 Long term (current) use of oral hypoglycemic drugs: Secondary | ICD-10-CM | POA: Insufficient documentation

## 2024-04-17 DIAGNOSIS — Z794 Long term (current) use of insulin: Secondary | ICD-10-CM | POA: Insufficient documentation

## 2024-04-17 DIAGNOSIS — E119 Type 2 diabetes mellitus without complications: Secondary | ICD-10-CM | POA: Insufficient documentation

## 2024-04-17 DIAGNOSIS — Z7985 Long-term (current) use of injectable non-insulin antidiabetic drugs: Secondary | ICD-10-CM | POA: Insufficient documentation

## 2024-04-17 DIAGNOSIS — Z85528 Personal history of other malignant neoplasm of kidney: Secondary | ICD-10-CM | POA: Insufficient documentation

## 2024-04-17 DIAGNOSIS — E785 Hyperlipidemia, unspecified: Secondary | ICD-10-CM | POA: Diagnosis not present

## 2024-04-17 DIAGNOSIS — K8689 Other specified diseases of pancreas: Secondary | ICD-10-CM

## 2024-04-17 DIAGNOSIS — I10 Essential (primary) hypertension: Secondary | ICD-10-CM | POA: Insufficient documentation

## 2024-04-17 DIAGNOSIS — Z7982 Long term (current) use of aspirin: Secondary | ICD-10-CM | POA: Insufficient documentation

## 2024-04-17 DIAGNOSIS — Z79899 Other long term (current) drug therapy: Secondary | ICD-10-CM | POA: Diagnosis not present

## 2024-04-17 DIAGNOSIS — F32A Depression, unspecified: Secondary | ICD-10-CM | POA: Insufficient documentation

## 2024-04-17 DIAGNOSIS — Z905 Acquired absence of kidney: Secondary | ICD-10-CM | POA: Diagnosis not present

## 2024-04-17 DIAGNOSIS — F419 Anxiety disorder, unspecified: Secondary | ICD-10-CM | POA: Diagnosis not present

## 2024-04-17 HISTORY — PX: ESOPHAGOGASTRODUODENOSCOPY: SHX5428

## 2024-04-17 HISTORY — PX: FINE NEEDLE ASPIRATION: SHX6590

## 2024-04-17 HISTORY — PX: EUS: SHX5427

## 2024-04-17 LAB — GLUCOSE, CAPILLARY: Glucose-Capillary: 156 mg/dL — ABNORMAL HIGH (ref 70–99)

## 2024-04-17 SURGERY — ULTRASOUND, UPPER GI TRACT, ENDOSCOPIC
Anesthesia: Monitor Anesthesia Care

## 2024-04-17 MED ORDER — SODIUM CHLORIDE 0.9 % IV SOLN
INTRAVENOUS | Status: DC
  Administered 2024-04-17: 10:00:00 500 mL via INTRAVENOUS

## 2024-04-17 MED ORDER — LIDOCAINE 2% (20 MG/ML) 5 ML SYRINGE
INTRAMUSCULAR | Status: DC | PRN
  Administered 2024-04-17: 11:00:00 100 mg via INTRAVENOUS

## 2024-04-17 MED ORDER — PROPOFOL 500 MG/50ML IV EMUL
INTRAVENOUS | Status: DC | PRN
  Administered 2024-04-17: 11:00:00 140 ug/kg/min via INTRAVENOUS

## 2024-04-17 MED ORDER — PROPOFOL 1000 MG/100ML IV EMUL
INTRAVENOUS | Status: AC
  Filled 2024-04-17: qty 100

## 2024-04-17 MED ADMIN — PROPOFOL 200 MG/20ML IV EMUL: 20 mg | INTRAVENOUS | @ 11:00:00 | NDC 00069023401

## 2024-04-17 NOTE — H&P (Signed)
 [facility] H/P Note  Chief Complaint: pancreatic lesion HPI: Christina Cook is an 73 y.o. female.  Pancreatic lesion seen MRI (done for surveillance prior kidney cancer).    Past Medical History:  Diagnosis Date   Acute medial meniscal injury of left knee    Anxiety    Arthritis    Cancer (HCC)    renal cell carcinoma, lt kidney removed   Depression    History of renal carcinoma    2007--  S/P LEFT NEPHRECTOMY--  NO CHEMORADIATION--  NO RECURRENCE   Hyperlipidemia    Hypertension    Lazy eye of both sides    Type 2 diabetes mellitus (HCC)     Past Surgical History:  Procedure Laterality Date   ABDOMINAL HYSTERECTOMY     APPENDECTOMY     BREAST EXCISIONAL BIOPSY Right    10-12 yrs ago, Benign   BREAST SURGERY     CATARACT EXTRACTION W/ INTRAOCULAR LENS  IMPLANT, BILATERAL     CHOLECYSTECTOMY     CYSTOSCOPY/URETEROSCOPY/HOLMIUM LASER/STENT PLACEMENT Right 11/15/2023   Procedure: CYSTOSCOPY/URETEROSCOPY/STENT PLACEMENT;  Surgeon: Devere Lonni Righter, MD;  Location: [facility];  Service: Urology;  Laterality: Right;   EYE SURGERY     interstitial cystitis     JOINT REPLACEMENT     left knee   KNEE ARTHROSCOPY WITH MEDIAL MENISECTOMY Left 12/04/2012   Procedure: LEFT KNEE ARTHROSCOPY WITH Partial MEDIAL MENISECTOMY, and Partial Lateral menisectomy, Abrasion chrondroplasty of left medial femoral condyle, Abrasion chrondroplasty of left lateral femoral condyle;  Surgeon: Tanda DELENA Heading, MD;  Location: [facility];  Service: Orthopedics;  Laterality: Left;   LAPAROSCOPIC LEFT RADICAL NEPHRECTOMY  08-16-2005   RIGHT SHOULDER OPEN ROTATOR CUFF REPAIR  06-05-2007   SHOULDER ARTHROSCOPY WITH DISTAL CLAVICLE RESECTION Right 11/13/2014   Procedure: SHOULDER ARTHROSCOPY WITH DISTAL CLAVICLE EXCISION, DEBRIDEMENT LABRUM AND BICEPS TENDON ;  Surgeon: Toribio Chancy, MD;  Location: [facility];  Service: Orthopedics;  Laterality: Right;   SHOULDER  ARTHROSCOPY WITH ROTATOR CUFF REPAIR AND SUBACROMIAL DECOMPRESSION Right 11/13/2014   Procedure: SHOULDER ARTHROSCOPY WITH ROTATOR CUFF REPAIR AND SUBACROMIAL DECOMPRESSION/ACROMIOPLASTY;  Surgeon: Toribio Chancy, MD;  Location: [facility];  Service: Orthopedics;  Laterality: Right;   TONSILLECTOMY     TOTAL KNEE ARTHROPLASTY Left 03/05/2014   Procedure: LEFT TOTAL KNEE ARTHROPLASTY;  Surgeon: Toribio JULIANNA Chancy, MD;  [facility];  Service: Orthopedics;  Laterality: Left;   TUBAL LIGATION      Medications Prior to Admission  Medication Sig Dispense Refill   aspirin 81 MG tablet Take 81 mg by mouth daily.     insulin  degludec (TRESIBA FLEXTOUCH) 200 UNIT/ML FlexTouch Pen Inject 16 Units into the skin in the morning.     metFORMIN  (GLUCOPHAGE -XR) 500 MG 24 hr tablet Take 1,000 mg by mouth 2 (two) times daily with a meal.     simvastatin  (ZOCOR ) 20 MG tablet Take 20 mg by mouth every evening.     insulin  aspart (NOVOLOG ) 100 UNIT/ML injection Inject 5-15 Units into the skin 3 (three) times daily before meals. Sliding scale     ondansetron  (ZOFRAN -ODT) 4 MG disintegrating tablet Take 1 tablet (4 mg total) by mouth every 6 (six) hours as needed for nausea or vomiting. (Patient not taking: Reported on 11/14/2023) 20 tablet 0   oxybutynin  (DITROPAN ) 5 MG tablet Take 1 tablet (5 mg total) by mouth every 8 (eight) hours as needed for bladder spasms. 30 tablet 1   OZEMPIC, 2 MG/DOSE,  8 MG/3ML SOPN Inject 1 mg into the skin every Monday.     phenazopyridine  (PYRIDIUM ) 200 MG tablet Take 1 tablet (200 mg total) by mouth 3 (three) times daily as needed (for pain with urination). 30 tablet 0   tamsulosin  (FLOMAX ) 0.4 MG CAPS capsule Take 1 capsule (0.4 mg total) by mouth daily. 30 capsule 0   traMADol  (ULTRAM ) 50 MG tablet Take 1 tablet (50 mg total) by mouth every 6 (six) hours as needed. 12 tablet 0    Allergies: Allergies[1]  History reviewed. No pertinent family history.  Social History:   reports that she has never smoked. She has never used smokeless tobacco. She reports that she does not drink alcohol and does not use drugs.   ROS: As per HPI, all others negative   Blood pressure (!) 152/88, pulse 71, temperature (!) 96.9 F (36.1 C), temperature source Temporal, resp. rate 10, height 5' 6.5 (1.689 m), weight 76 kg, SpO2 98%. General appearance: NAD HEENT:  Anicteric LUNGS:  No visible distress CV:  No tachycardia ABD:  Soft, non-tender NEURO:  No encephalopathy  Results for orders placed or performed during the hospital encounter of 04/17/24 (from the past 48 hours)  Glucose, capillary     Status: Abnormal   Collection Time: 04/17/24  9:39 AM  Result Value Ref Range   Glucose-Capillary 156 (H) 70 - 99 mg/dL    Comment: Glucose reference range applies only to samples taken after fasting for at least 8 hours.   No results found.  Assessment/Plan   Lesion tail of pancreas. Upper endoscopic ultrasound with possible fine needle aspiration. Risks (bleeding, infection, bowel perforation that could require surgery, sedation-related changes in cardiopulmonary systems), benefits (identification and possible treatment of source of symptoms, exclusion of certain causes of symptoms), and alternatives (watchful waiting, radiographic imaging studies, empiric medical treatment) of upper endoscopy with ultrasound and possible fine needle aspiration (EUS +/- FNA) were explained to patient/family in detail and patient wishes to proceed.   [name] 04/17/2024, 10:40 AM     [1]  Allergies Allergen Reactions   Dramamine [Dimenhydrinate] Nausea And Vomiting   Lisinopril Cough

## 2024-04-17 NOTE — Discharge Instructions (Signed)

## 2024-04-17 NOTE — Anesthesia Preprocedure Evaluation (Signed)
 Anesthesia Evaluation  Patient identified by MRN, date of birth, ID band Patient awake    Reviewed: Allergy & Precautions, NPO status , Patient's Chart, lab work & pertinent test results  History of Anesthesia Complications Negative for: history of anesthetic complications  Airway Mallampati: II  TM Distance: >3 FB Neck ROM: Full    Dental no notable dental hx. (+) Teeth Intact   Pulmonary neg pulmonary ROS, neg sleep apnea, neg COPD, Patient abstained from smoking.Not current smoker   Pulmonary exam normal breath sounds clear to auscultation       Cardiovascular Exercise Tolerance: Good METShypertension, Pt. on medications (-) CAD and (-) Past MI (-) dysrhythmias  Rhythm:Regular Rate:Normal - Systolic murmurs    Neuro/Psych  PSYCHIATRIC DISORDERS Anxiety Depression    negative neurological ROS     GI/Hepatic ,neg GERD  ,,(+)     (-) substance abuse    Endo/Other  diabetes, Insulin  Dependent  GLP1ra last taken over a month ago.  Renal/GU negative Renal ROS     Musculoskeletal   Abdominal   Peds  Hematology   Anesthesia Other Findings Past Medical History: No date: Acute medial meniscal injury of left knee No date: Anxiety No date: Arthritis No date: Cancer [facility])     Comment:  renal cell carcinoma, lt kidney removed No date: Depression No date: History of renal carcinoma     Comment:  2007--  S/P LEFT NEPHRECTOMY--  NO CHEMORADIATION--  NO               RECURRENCE No date: Hyperlipidemia No date: Hypertension No date: Lazy eye of both sides No date: Type 2 diabetes mellitus (HCC)  Reproductive/Obstetrics                              Anesthesia Physical Anesthesia Plan  ASA: 3  Anesthesia Plan: MAC   Post-op Pain Management: Minimal or no pain anticipated   Induction: Intravenous  PONV Risk Score and Plan: 2 and Propofol  infusion, TIVA and Ondansetron   Airway Management  Planned: Nasal Cannula  Additional Equipment: None  Intra-op Plan:   Post-operative Plan:   Informed Consent: I have reviewed the patients History and Physical, chart, labs and discussed the procedure including the risks, benefits and alternatives for the proposed anesthesia with the patient or authorized representative who has indicated his/her understanding and acceptance.     Dental advisory given  Plan Discussed with: CRNA and Surgeon  Anesthesia Plan Comments: (Discussed risks of anesthesia with patient, including possibility of difficulty with spontaneous ventilation under anesthesia necessitating airway intervention, PONV, and rare risks such as cardiac or respiratory or neurological events, and allergic reactions. Discussed the role of CRNA in patient's perioperative care. Patient understands.)        Anesthesia Quick Evaluation

## 2024-04-17 NOTE — Transfer of Care (Signed)
 Immediate Anesthesia Transfer of Care Note  Patient: Christina Cook  Procedure(s) Performed: ULTRASOUND, UPPER GI TRACT, ENDOSCOPIC  Patient Location: PACU  Anesthesia Type:MAC  Level of Consciousness: awake, alert , oriented, and patient cooperative  Airway & Oxygen Therapy: Patient Spontanous Breathing  Post-op Assessment: Report given to RN and Post -op Vital signs reviewed and stable  Post vital signs: Reviewed and stable  Last Vitals:  Vitals Value Taken Time  BP 137/80 04/17/24 11:21  Temp 36.7 C 04/17/24 11:20  Pulse 67 04/17/24 11:23  Resp 18 04/17/24 11:23  SpO2 100 % 04/17/24 11:23  Vitals shown include unfiled device data.  Last Pain:  Vitals:   04/17/24 1120  TempSrc: Temporal  PainSc: 0-No pain      Patients Stated Pain Goal: 0 (04/17/24 1120)  Complications: No notable events documented.

## 2024-04-17 NOTE — Op Note (Signed)
 [facility] Patient Name: Christina Cook Procedure Date: 04/17/2024 MRN: [id number] Attending MD: Elsie Cree , MD, [id number] Date of Birth: 02-20-1951 CSN: [id number] Age: 73 Admit Type: Outpatient Procedure:                Upper EUS Indications:              Suspected mass in pancreas on MRI Providers:                Elsie Cree, MD, Darleene Bare, RN, Haskel Chris, Technician Referring MD:             Dr. Joen Gentry. Medicines:                Monitored Anesthesia Care Complications:            No immediate complications. Estimated Blood Loss:     Estimated blood loss: none. Procedure:                Pre-Anesthesia Assessment:                           - Prior to the procedure, a History and Physical                            was performed, and patient medications and                            allergies were reviewed. The patient's tolerance of                            previous anesthesia was also reviewed. The risks                            and benefits of the procedure and the sedation                            options and risks were discussed with the patient.                            All questions were answered, and informed consent                            was obtained. Prior Anticoagulants: The patient has                            taken no anticoagulant or antiplatelet agents                            except for aspirin. ASA Grade Assessment: III - A                            patient with severe systemic disease. After  reviewing the risks and benefits, the patient was                            deemed in satisfactory condition to undergo the                            procedure.                           After obtaining informed consent, the endoscope was                            passed under direct vision. Throughout the                            procedure, the patient's  blood pressure, pulse, and                            oxygen saturations were monitored continuously. The                            [id number] ([id number] Olympus endosonoscope was                            introduced through the mouth, and advanced to the                            second part of duodenum. The upper EUS was                            accomplished without difficulty. The patient                            tolerated the procedure well. Scope In: Scope Out: Findings:      ENDOSONOGRAPHIC FINDING: :      There was no sign of significant endosonographic abnormality in the       ampulla.      No lymphadenopathy seen.      There was no sign of significant endosonographic abnormality in the       common bile duct. No masses and no stones were identified.      An irregular mass was identified in the pancreatic tail. The mass was       heterogenous. The mass measured 25 mm by 33 mm in maximal       cross-sectional diameter. The endosonographic borders were       poorly-defined. An intact interface was seen between the mass and the       celiac trunk suggesting a lack of invasion. Fine needle aspiration for       cytology was performed. Color Doppler imaging was utilized prior to       needle puncture to confirm a lack of significant vascular structures       within the needle path. Five passes were made with the 25 gauge needle       using a transgastric approach. A stylet was used. A cytotechnologist was       present to evaluate  the adequacy of the specimen. Final cytology results       are pending. Impression:               - There was no sign of significant pathology in the                            ampulla.                           - There was no sign of significant pathology in the                            common bile duct.                           - A mass was identified in the pancreatic tail.                            Could be solid pseudopapillary lesion versus                             neuroendocrine lesion versus other. Fine needle                            aspiration performed. Moderate Sedation:      Not Applicable - Patient had care per Anesthesia. Recommendation:           - Discharge patient to home (via wheelchair).                           - Resume previous diet today.                           - Continue present medications.                           - Await cytology results.                           - Return to GI clinic after studies are complete.                           - Return to referring physician as previously                            scheduled. Procedure Code(s):        --- Professional ---                           [phone], Esophagogastroduodenoscopy, flexible,                            transoral; with transendoscopic ultrasound-guided                            intramural or transmural fine needle  aspiration/biopsy(s) (includes endoscopic                            ultrasound examination of the esophagus, stomach,                            and either the duodenum or a surgically altered                            stomach where the jejunum is examined distal to the                            anastomosis) Diagnosis Code(s):        --- Professional ---                           K86.89, Other specified diseases of pancreas                           R93.3, Abnormal findings on diagnostic imaging of                            other parts of digestive tract CPT copyright 2022 [employer]. All rights reserved. The codes documented in this report are preliminary and upon coder review may  be revised to meet current compliance requirements. Elsie Cree, MD 04/17/2024 11:31:59 AM This report has been signed electronically. Number of Addenda: 0

## 2024-04-17 NOTE — Anesthesia Postprocedure Evaluation (Signed)
 Anesthesia Post Note  Patient: Christina Cook  Procedure(s) Performed: ULTRASOUND, UPPER GI TRACT, ENDOSCOPIC     Patient location during evaluation: Endoscopy Anesthesia Type: MAC Level of consciousness: awake and alert Pain management: pain level controlled Vital Signs Assessment: post-procedure vital signs reviewed and stable Respiratory status: spontaneous breathing, nonlabored ventilation, respiratory function stable and patient connected to nasal cannula oxygen Cardiovascular status: blood pressure returned to baseline and stable Postop Assessment: no apparent nausea or vomiting Anesthetic complications: no   No notable events documented.  Last Vitals:  Vitals:   04/17/24 1130 04/17/24 1140  BP: (!) 136/92 130/78  Pulse: (!) 54 63  Resp: 20 20  Temp:    SpO2: 100% 97%    Last Pain:  Vitals:   04/17/24 1140  TempSrc:   PainSc: 0-No pain                 Rome Ade

## 2024-04-18 LAB — CYTOLOGY - NON PAP

## 2024-04-19 ENCOUNTER — Encounter (HOSPITAL_COMMUNITY): Payer: Self-pay | Admitting: Gastroenterology

## 2024-05-15 ENCOUNTER — Other Ambulatory Visit: Payer: Self-pay | Admitting: *Deleted

## 2024-05-15 NOTE — Progress Notes (Signed)
 The proposed treatment discussed in conference is for discussion purpose only and is not a binding recommendation.  The patients have not been physically examined, or presented with their treatment options.  Therefore, final treatment plans cannot be decided.

## 2024-05-16 ENCOUNTER — Other Ambulatory Visit: Payer: Self-pay | Admitting: Surgery

## 2024-05-16 ENCOUNTER — Ambulatory Visit: Payer: Self-pay | Admitting: Surgery

## 2024-05-16 DIAGNOSIS — K8689 Other specified diseases of pancreas: Secondary | ICD-10-CM

## 2024-05-16 NOTE — Addendum Note (Signed)
 Addended by: DASIE LEONOR CROME on: 05/16/2024 09:39 AM   Modules accepted: Orders

## 2024-05-17 ENCOUNTER — Other Ambulatory Visit (HOSPITAL_COMMUNITY): Payer: Self-pay

## 2024-05-17 MED ORDER — BEXSERO 0.5 ML IM SUSY
PREFILLED_SYRINGE | INTRAMUSCULAR | 0 refills | Status: AC
  Filled 2024-05-17: qty 0.5, 1d supply, fill #0

## 2024-06-04 ENCOUNTER — Telehealth: Payer: Self-pay

## 2024-06-04 NOTE — Telephone Encounter (Signed)
 GI Tumor Boards  Christina Cook was presented on the GI tumor board. Christina Cook has a personal history of pancreatic cancer.  Christina Cook meets [employer] ([employer]) criteria for genetic testing for based on her personal history of pancreatic cancer.  Santana Fryer, MS, CGC  Certified Genetic Counselor  Email: [email or web address]  Phone: [phone]

## 2024-06-20 ENCOUNTER — Inpatient Hospital Stay (HOSPITAL_COMMUNITY): Admit: 2024-06-20 | Admitting: Surgery

## 2024-06-20 SURGERY — PANCREATECTOMY, LAPAROSCOPIC
Anesthesia: General
